# Patient Record
Sex: Male | Born: 1970 | Race: White | Hispanic: No | Marital: Married | Smoking: Former smoker
Health system: Southern US, Community
[De-identification: ages and names within clinical notes are randomized; demographics above are authoritative.]

## PROBLEM LIST (undated history)

## (undated) DIAGNOSIS — G629 Polyneuropathy, unspecified: Secondary | ICD-10-CM

## (undated) DIAGNOSIS — I10 Essential (primary) hypertension: Secondary | ICD-10-CM

## (undated) HISTORY — PX: HEMORRHOID SURGERY: SHX153

---

## 2003-10-04 ENCOUNTER — Other Ambulatory Visit: Payer: Self-pay

## 2004-10-24 ENCOUNTER — Other Ambulatory Visit: Payer: Self-pay

## 2004-10-24 ENCOUNTER — Emergency Department: Payer: Self-pay | Admitting: Emergency Medicine

## 2006-05-28 ENCOUNTER — Emergency Department: Payer: Self-pay

## 2006-05-28 ENCOUNTER — Other Ambulatory Visit: Payer: Self-pay

## 2006-08-14 ENCOUNTER — Emergency Department: Payer: Self-pay | Admitting: Emergency Medicine

## 2006-11-11 ENCOUNTER — Emergency Department: Payer: Self-pay | Admitting: Emergency Medicine

## 2008-03-23 ENCOUNTER — Emergency Department: Payer: Self-pay | Admitting: Emergency Medicine

## 2008-03-23 ENCOUNTER — Other Ambulatory Visit: Payer: Self-pay

## 2008-04-03 ENCOUNTER — Emergency Department: Payer: Self-pay | Admitting: Unknown Physician Specialty

## 2009-12-05 ENCOUNTER — Emergency Department: Payer: Self-pay

## 2012-02-05 ENCOUNTER — Emergency Department: Payer: Self-pay | Admitting: Emergency Medicine

## 2012-02-05 LAB — COMPREHENSIVE METABOLIC PANEL
Albumin: 3.6 g/dL (ref 3.4–5.0)
Alkaline Phosphatase: 83 U/L (ref 50–136)
Anion Gap: 7 (ref 7–16)
BUN: 13 mg/dL (ref 7–18)
Calcium, Total: 8.3 mg/dL — ABNORMAL LOW (ref 8.5–10.1)
Creatinine: 1.08 mg/dL (ref 0.60–1.30)
Glucose: 94 mg/dL (ref 65–99)
Osmolality: 281 (ref 275–301)
Potassium: 4 mmol/L (ref 3.5–5.1)
Sodium: 141 mmol/L (ref 136–145)
Total Protein: 6.8 g/dL (ref 6.4–8.2)

## 2012-02-05 LAB — CBC
HCT: 44.1 % (ref 40.0–52.0)
MCH: 32.8 pg (ref 26.0–34.0)
MCHC: 33.8 g/dL (ref 32.0–36.0)
MCV: 97 fL (ref 80–100)
Platelet: 191 10*3/uL (ref 150–440)
RDW: 13 % (ref 11.5–14.5)
WBC: 7.5 10*3/uL (ref 3.8–10.6)

## 2012-02-05 LAB — URINALYSIS, COMPLETE
Bilirubin,UR: NEGATIVE
Ketone: NEGATIVE
Leukocyte Esterase: NEGATIVE
Nitrite: NEGATIVE
Ph: 6 (ref 4.5–8.0)
RBC,UR: NONE SEEN /HPF (ref 0–5)
Specific Gravity: 1.005 (ref 1.003–1.030)
Squamous Epithelial: <1
WBC UR: <1 /HPF (ref 0–5)

## 2013-10-13 ENCOUNTER — Emergency Department: Payer: Self-pay | Admitting: Internal Medicine

## 2014-06-30 ENCOUNTER — Emergency Department: Payer: Self-pay | Admitting: Emergency Medicine

## 2014-06-30 LAB — URINALYSIS, COMPLETE
BACTERIA: NONE SEEN
Bilirubin,UR: NEGATIVE
Glucose,UR: NEGATIVE mg/dL (ref 0–75)
KETONE: NEGATIVE
Leukocyte Esterase: NEGATIVE
NITRITE: NEGATIVE
Ph: 5 (ref 4.5–8.0)
Protein: NEGATIVE
RBC,UR: 1 /HPF (ref 0–5)
SPECIFIC GRAVITY: 1.02 (ref 1.003–1.030)
Squamous Epithelial: NONE SEEN

## 2014-06-30 LAB — CBC WITH DIFFERENTIAL/PLATELET
BASOS ABS: 0.1 10*3/uL (ref 0.0–0.1)
BASOS PCT: 0.8 %
EOS ABS: 0.3 10*3/uL (ref 0.0–0.7)
Eosinophil %: 2.1 %
HCT: 49.2 % (ref 40.0–52.0)
HGB: 16.9 g/dL (ref 13.0–18.0)
LYMPHS ABS: 2.3 10*3/uL (ref 1.0–3.6)
Lymphocyte %: 19.5 %
MCH: 32.2 pg (ref 26.0–34.0)
MCHC: 34.4 g/dL (ref 32.0–36.0)
MCV: 94 fL (ref 80–100)
MONO ABS: 0.7 x10 3/mm (ref 0.2–1.0)
Monocyte %: 5.6 %
NEUTROS ABS: 8.6 10*3/uL — AB (ref 1.4–6.5)
Neutrophil %: 72 %
Platelet: 266 10*3/uL (ref 150–440)
RBC: 5.25 10*6/uL (ref 4.40–5.90)
RDW: 13.7 % (ref 11.5–14.5)
WBC: 11.9 10*3/uL — AB (ref 3.8–10.6)

## 2014-06-30 LAB — COMPREHENSIVE METABOLIC PANEL
Albumin: 3.8 g/dL (ref 3.4–5.0)
Alkaline Phosphatase: 84 U/L
Anion Gap: 8 (ref 7–16)
BILIRUBIN TOTAL: 0.4 mg/dL (ref 0.2–1.0)
BUN: 14 mg/dL (ref 7–18)
CHLORIDE: 107 mmol/L (ref 98–107)
Calcium, Total: 8.7 mg/dL (ref 8.5–10.1)
Co2: 25 mmol/L (ref 21–32)
Creatinine: 0.92 mg/dL (ref 0.60–1.30)
EGFR (African American): >60
EGFR (Non-African Amer.): >60
GLUCOSE: 123 mg/dL — AB (ref 65–99)
Osmolality: 281 (ref 275–301)
Potassium: 3.8 mmol/L (ref 3.5–5.1)
SGOT(AST): 28 U/L (ref 15–37)
SGPT (ALT): 59 U/L
Sodium: 140 mmol/L (ref 136–145)
TOTAL PROTEIN: 7.4 g/dL (ref 6.4–8.2)

## 2014-06-30 LAB — LIPASE, BLOOD: LIPASE: 178 U/L (ref 73–393)

## 2014-06-30 LAB — TROPONIN I

## 2015-05-24 ENCOUNTER — Encounter: Payer: Self-pay | Admitting: Emergency Medicine

## 2015-05-24 ENCOUNTER — Emergency Department
Admission: EM | Admit: 2015-05-24 | Discharge: 2015-05-24 | Disposition: A | Discharge status: Home / Self Care | Payer: Self-pay | Attending: Emergency Medicine | Admitting: Emergency Medicine

## 2015-05-24 ENCOUNTER — Emergency Department: Payer: Self-pay

## 2015-05-24 DIAGNOSIS — I499 Cardiac arrhythmia, unspecified: Secondary | ICD-10-CM | POA: Insufficient documentation

## 2015-05-24 DIAGNOSIS — M25521 Pain in right elbow: Secondary | ICD-10-CM | POA: Insufficient documentation

## 2015-05-24 DIAGNOSIS — Z720 Tobacco use: Secondary | ICD-10-CM | POA: Insufficient documentation

## 2015-05-24 DIAGNOSIS — M79631 Pain in right forearm: Secondary | ICD-10-CM | POA: Insufficient documentation

## 2015-05-24 DIAGNOSIS — R5383 Other fatigue: Secondary | ICD-10-CM | POA: Insufficient documentation

## 2015-05-24 DIAGNOSIS — M25572 Pain in left ankle and joints of left foot: Secondary | ICD-10-CM | POA: Insufficient documentation

## 2015-05-24 LAB — GLUCOSE, CAPILLARY: GLUCOSE-CAPILLARY: 86 mg/dL (ref 65–99)

## 2015-05-24 MED ORDER — HYDROCODONE-ACETAMINOPHEN 5-325 MG PO TABS
2.0000 | ORAL_TABLET | Freq: Once | ORAL | Status: AC
  Administered 2015-05-24: 2 via ORAL
  Filled 2015-05-24: qty 2

## 2015-05-24 MED ORDER — KETOROLAC TROMETHAMINE 60 MG/2ML IM SOLN
60.0000 mg | Freq: Once | INTRAMUSCULAR | Status: AC
  Administered 2015-05-24: 60 mg via INTRAMUSCULAR
  Filled 2015-05-24: qty 2

## 2015-05-24 MED ORDER — IBUPROFEN 800 MG PO TABS: 800.0000 mg | ORAL_TABLET | Freq: Three times a day (TID) | ORAL | Status: AC | PRN

## 2015-05-24 MED ORDER — HYDROCODONE-ACETAMINOPHEN 5-325 MG PO TABS: 1.0000 | ORAL_TABLET | ORAL | Status: AC | PRN

## 2015-05-24 NOTE — ED Notes (Signed)
Pt presents with left ankle pain and swelling for three mths, no known injury,.

## 2015-05-24 NOTE — Discharge Instructions (Signed)

## 2015-05-24 NOTE — ED Provider Notes (Signed)
[facility] Emergency Department Provider Note  ____________________________________________  Time seen: Approximately 3:51 PM  I have reviewed the triage vital signs and the nursing notes.   HISTORY  Chief Complaint Joint Swelling   HPI Tommy Jacobs is a 44 y.o. male who presents with left foot and ankle pain and swelling for 3 months. He denies trauma. 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 He also drove for relief work in [place] after [employer]. The pain is 10/10; the bottom of his foot as burning pain and pins and needles sensation; the dorsal surface feels numb and heavy, like it is asleep. He denies weakness, but the pain is worse when he bears weight. He has tried ice and ibuprofen with no relief.   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   He has broken his right ankle once and his left ankle three times when playing high school basketball.  He mentioned he has measured FBS in the 200's at home several times, though he's never had a diagnosis of diabetes.  He also mentions (during my ROS questioning) trouble staying asleep, limited ROM and pain in his right hand that shoots up to his elbow, and an irregular heart rhythm diagnosed when he was a child.   History reviewed. No pertinent past medical history.  There are no active problems to display for this patient.   History reviewed. No pertinent past surgical history.  Current Outpatient Rx  Name  Route  Sig  Dispense  Refill  . HYDROcodone-acetaminophen (NORCO) 5-325 MG tablet   Oral   Take 1-2 tablets by mouth every 4 (four) hours as needed for moderate pain.   15 tablet   0   . ibuprofen (ADVIL,MOTRIN) 800 MG tablet   Oral   Take 1 tablet (800 mg total) by mouth every 8 (eight) hours as needed.  30 tablet   0     Allergies Codeine and Diphenhydramine  No family history on file.  Social History Social History  Substance Use Topics  . Smoking status: Current Some Day Smoker  . Smokeless tobacco: None  . Alcohol Use: No     Comment: former drinker    Review of Systems Constitutional: No fever/chills; reports trouble staying asleep, fatigue. Eyes: No visual changes. ENT: No sore throat. Cardiovascular: Denies chest pain. Respiratory: Denies shortness of breath. Gastrointestinal: No abdominal pain.  No nausea, no vomiting.  No diarrhea.  No constipation. Genitourinary: Negative for dysuria. Musculoskeletal: Negative for back pain. Positive for pain in right forearm radiating to elbow. Positive for swelling and pain in left foot and ankle. Skin: Negative for rash. Neurological: Negative for headaches, focal weakness or numbness.  10-point ROS otherwise negative.  ____________________________________________   PHYSICAL EXAM:  VITAL SIGNS: ED Triage Vitals  Enc Vitals Group     BP 05/24/15 1537 153/102 mmHg     Pulse Rate 05/24/15 1537 71     Resp 05/24/15 1537 18     Temp 05/24/15 1537 98.1 F (36.7 C)     Temp Source 05/24/15 1537 Oral     SpO2 05/24/15 1537 97 %     Weight 05/24/15 1537 [facility] 1537  (1.803 m)     Head Cir --  Peak Flow --      Pain Score 05/24/15 1538 10     Pain Loc --      Pain Edu? --      Excl. in GC? --     Constitutional: Alert and oriented. Well appearing and in no acute distress. Eyes: Conjunctivae are normal. PERRL. EOMI. Head: Atraumatic. Nose: No congestion/rhinnorhea. Mouth/Throat: Mucous membranes are moist.  Oropharynx non-erythematous. Neck: No stridor.   Cardiovascular: Normal rate, irregular rhythm. Grossly normal heart sounds.  Good peripheral circulation. Pedal pulses 3+ bilaterally. Respiratory: Normal respiratory effort.  No retractions. Lungs CTAB. Gastrointestinal:  Soft and nontender. No distention.  Musculoskeletal: 5/5 strength in knees, ankles, toes bilaterally. Right foot and ankle non-tender to palpation; full ROM. Left foot and ankle limited ROM, tender to palpation especially plantar surface and medial malleolus. No tenderness to palpation over Achilles tendon. Neurologic:  Normal speech and language. No gross focal neurologic deficits are appreciated. No gait instability. Skin:  Skin is warm, dry and intact. No rash noted. Psychiatric: Mood and affect are normal. Speech and behavior are normal.  ____________________________________________   LABS (all labs ordered are listed, but only abnormal results are displayed)  Labs Reviewed  GLUCOSE, CAPILLARY  CBG MONITORING, ED   ____________________________________________  EKG  None ____________________________________________  RADIOLOGY  Xray left foot, xray left ankle. ____________________________________________   PROCEDURES  Procedure(s) performed: None  Critical Care performed: No  ____________________________________________   INITIAL IMPRESSION / ASSESSMENT AND PLAN / ED COURSE  Pertinent labs & imaging results that were available during my care of the patient were reviewed by me and considered in my medical decision making (see chart for details).  Bone spur vs. Fracture vs diabetic neuropathy vs. Plantar fasciitis.  POC CBG 86.  Xray left foot and ankle shows no fracture or bone spur.   He needs to establish care with a PCP for management of blood sugar and other regular health maintenance. Will refer to Phineas Realharles Drew. ____________________________________________   FINAL CLINICAL IMPRESSION(S) / ED DIAGNOSES  Final diagnoses:  Joint pain of ankle and foot, left      Evangeline Dakinharles M Dwaine Pringle, PA-C 05/24/15 1903  Phineas SemenGraydon Goodman, MD 05/24/15 1926

## 2015-07-05 ENCOUNTER — Emergency Department: Payer: Self-pay

## 2015-07-05 ENCOUNTER — Emergency Department
Admission: EM | Admit: 2015-07-05 | Discharge: 2015-07-05 | Disposition: A | Discharge status: Home / Self Care | Payer: Self-pay | Attending: Student | Admitting: Student

## 2015-07-05 DIAGNOSIS — X501XXA Overexertion from prolonged static or awkward postures, initial encounter: Secondary | ICD-10-CM | POA: Insufficient documentation

## 2015-07-05 DIAGNOSIS — F17210 Nicotine dependence, cigarettes, uncomplicated: Secondary | ICD-10-CM | POA: Insufficient documentation

## 2015-07-05 DIAGNOSIS — Y9389 Activity, other specified: Secondary | ICD-10-CM | POA: Insufficient documentation

## 2015-07-05 DIAGNOSIS — S93402A Sprain of unspecified ligament of left ankle, initial encounter: Secondary | ICD-10-CM | POA: Insufficient documentation

## 2015-07-05 DIAGNOSIS — Z79899 Other long term (current) drug therapy: Secondary | ICD-10-CM | POA: Insufficient documentation

## 2015-07-05 DIAGNOSIS — Y9289 Other specified places as the place of occurrence of the external cause: Secondary | ICD-10-CM | POA: Insufficient documentation

## 2015-07-05 DIAGNOSIS — S93402D Sprain of unspecified ligament of left ankle, subsequent encounter: Secondary | ICD-10-CM

## 2015-07-05 DIAGNOSIS — Y998 Other external cause status: Secondary | ICD-10-CM | POA: Insufficient documentation

## 2015-07-05 HISTORY — DX: Polyneuropathy, unspecified: G62.9

## 2015-07-05 MED ORDER — NAPROXEN 500 MG PO TABS: 500.0000 mg | ORAL_TABLET | Freq: Two times a day (BID) | ORAL | Status: DC

## 2015-07-05 MED ORDER — TRAMADOL HCL 50 MG PO TABS: 50.0000 mg | ORAL_TABLET | Freq: Four times a day (QID) | ORAL | Status: DC | PRN

## 2015-07-05 MED ORDER — OXYCODONE-ACETAMINOPHEN 5-325 MG PO TABS
2.0000 | ORAL_TABLET | Freq: Once | ORAL | Status: AC
  Administered 2015-07-05: 2 via ORAL
  Filled 2015-07-05: qty 2

## 2015-07-05 MED ORDER — OXYCODONE-ACETAMINOPHEN 5-325 MG PO TABS: 2.0000 | ORAL_TABLET | Freq: Once | ORAL | Status: DC

## 2015-07-05 NOTE — ED Notes (Signed)
Pt wheeled out to wifes car

## 2015-07-05 NOTE — ED Notes (Signed)
States he twisted his ankle on Monday conts to have pain and swelling. Unable to bear wt

## 2015-07-05 NOTE — ED Provider Notes (Signed)
[facility] Emergency Department Provider Note  ____________________________________________  Time seen: Approximately 1:10 PM  I have reviewed the triage vital signs and the nursing notes.   HISTORY  Chief Complaint Ankle Pain    HPI Tommy Jacobs is a 44 y.o. male complaining of left ankle pain secondary to a twisting incident in the shower last night. Patient states since is that he's been unable to bear weight secondary to this pain complaint. Patient is rating his pain as a 10 over 10 and describes pain as sharp. No palliative measures taken  prior to arrival to the ER.  Icepack was given at triage.Patient drove himself to the ER but refused weight-bear after coming to the hospital.   Past Medical History  Diagnosis Date  . Neuropathy (HCC)     There are no active problems to display for this patient.   Past Surgical History  Procedure Laterality Date  . Hemorrhoid surgery      Current Outpatient Rx  Name  Route  Sig  Dispense  Refill  . gabapentin (NEURONTIN) 300 MG capsule   Oral   Take 300 mg by mouth 3 (three) times daily.         Marland Kitchen. HYDROcodone-acetaminophen (NORCO) 5-325 MG tablet   Oral   Take 1-2 tablets by mouth every 4 (four) hours as needed for moderate pain.   15 tablet   0   . ibuprofen (ADVIL,MOTRIN) 800 MG tablet   Oral   Take 1 tablet (800 mg total) by mouth every 8 (eight) hours as needed.   30 tablet   0     Allergies Codeine and Diphenhydramine  No family history on file.  Social History Social History  Substance Use Topics  . Smoking status: Current Some Day Smoker    Types: Cigarettes  . Smokeless tobacco: None  . Alcohol Use: No     Comment: former drinker    Review of Systems Constitutional: No fever/chills Eyes: No visual changes. ENT: No sore throat. Cardiovascular: Denies chest pain. Respiratory: Denies shortness of breath. Gastrointestinal: No abdominal pain.  No nausea, no vomiting.   No diarrhea.  No constipation. Genitourinary: Negative for dysuria. Musculoskeletal: Left ankle pain  Skin: Negative for rash. Neurological: Negative for headaches, focal weakness or numbness. Allergic/Immunilogical: Codeine and Benadryl 10-point ROS otherwise negative.  ____________________________________________   PHYSICAL EXAM:  VITAL SIGNS: ED Triage Vitals  Enc Vitals Group     BP 07/05/15 1237 153/88 mmHg     Pulse Rate 07/05/15 1237 81     Resp 07/05/15 1237 18     Temp 07/05/15 1237 97.7 F (36.5 C)     Temp Source 07/05/15 1237 Oral     SpO2 07/05/15 1237 97 %     Weight 07/05/15 1237 220 lb (99.791 kg)     Height 07/05/15 1237 5\' 11"  (1.803 m)     Head Cir --      Peak Flow --      Pain Score 07/05/15 1258 10     Pain Loc --      Pain Edu? --      Excl. in GC? --     Constitutional: Alert and oriented. Well appearing and in no acute distress. Eyes: Conjunctivae are normal. PERRL. EOMI. Head: Atraumatic. Nose: No congestion/rhinnorhea. Mouth/Throat: Mucous membranes are moist.  Oropharynx non-erythematous. Neck: No stridor.  No cervical spine tenderness to palpation. Hematological/Lymphatic/Immunilogical: No cervical lymphadenopathy. Cardiovascular: Normal rate, regular rhythm. Grossly normal heart sounds.  Good peripheral circulation. Respiratory: Normal respiratory effort.  No retractions. Lungs CTAB. Gastrointestinal: Soft and nontender. No distention. No abdominal bruits. No CVA tenderness. Musculoskeletal: No obvious deformity or edema to the ankle. Moderate Guarding with palpation medial and lateral aspect of the ankle.  No joint effusions. Neurologic:  Normal speech and language. No gross focal neurologic deficits are appreciated. No gait instability. Skin:  Skin is warm, dry and intact. No rash noted. Psychiatric: Mood and affect are normal. Speech and behavior are normal.  ____________________________________________   LABS (all labs ordered are  listed, but only abnormal results are displayed)  Labs Reviewed - No data to display ____________________________________________  EKG   ____________________________________________  RADIOLOGY  No acute findings on x-ray. I, Joni Reining, personally viewed and evaluated these images (plain radiographs) as part of my medical decision making.   ____________________________________________   PROCEDURES  Procedure(s) performed: None  Critical Care performed: No  ____________________________________________   INITIAL IMPRESSION / ASSESSMENT AND PLAN / ED COURSE  Pertinent labs & imaging results that were available during my care of the patient were reviewed by me and considered in my medical decision making (see chart for details).  Sprain left ankle. Patient given  discharged care instructions placing the ankle stirrup splint. Given a prescription for ibuprofen. Patient advised to follow orthopedics due to his  history of spraining his ankle. ____________________________________________   FINAL CLINICAL IMPRESSION(S) / ED DIAGNOSES  Final diagnoses:  Left ankle sprain, subsequent encounter      Joni Reining, PA-C 07/05/15 1359  Gayla Doss, MD 07/05/15 1550

## 2015-07-05 NOTE — ED Notes (Signed)
Xray at BS for portable  

## 2015-07-05 NOTE — ED Notes (Signed)
Pt aware from when pain meds given cannot drive after. Waiting in room until wife arrives.

## 2015-07-05 NOTE — Discharge Instructions (Signed)
Wear ankle support for 3-5 days and follow orthopedics clinic for definitive evaluation and treatment

## 2015-07-05 NOTE — ED Notes (Signed)
Pt c/o left ankle pain since twisting it in the shower Monday night.

## 2016-01-17 DIAGNOSIS — M25461 Effusion, right knee: Secondary | ICD-10-CM | POA: Insufficient documentation

## 2016-01-22 DIAGNOSIS — I10 Essential (primary) hypertension: Secondary | ICD-10-CM | POA: Insufficient documentation

## 2016-02-27 IMAGING — CR DG ANKLE COMPLETE 3+V*L*
1 series · 3 of 3 positions shown · non-contrast
Comparison: 05/24/2015

CLINICAL DATA: Rolled left ankle [REDACTED] night in the shower,
diffuse ankle pain

EXAM:
LEFT ANKLE COMPLETE - 3+ VIEW

[Series 1: ap · 0.17mm/px · 3 of 3 slices shown]
[im 1/3]
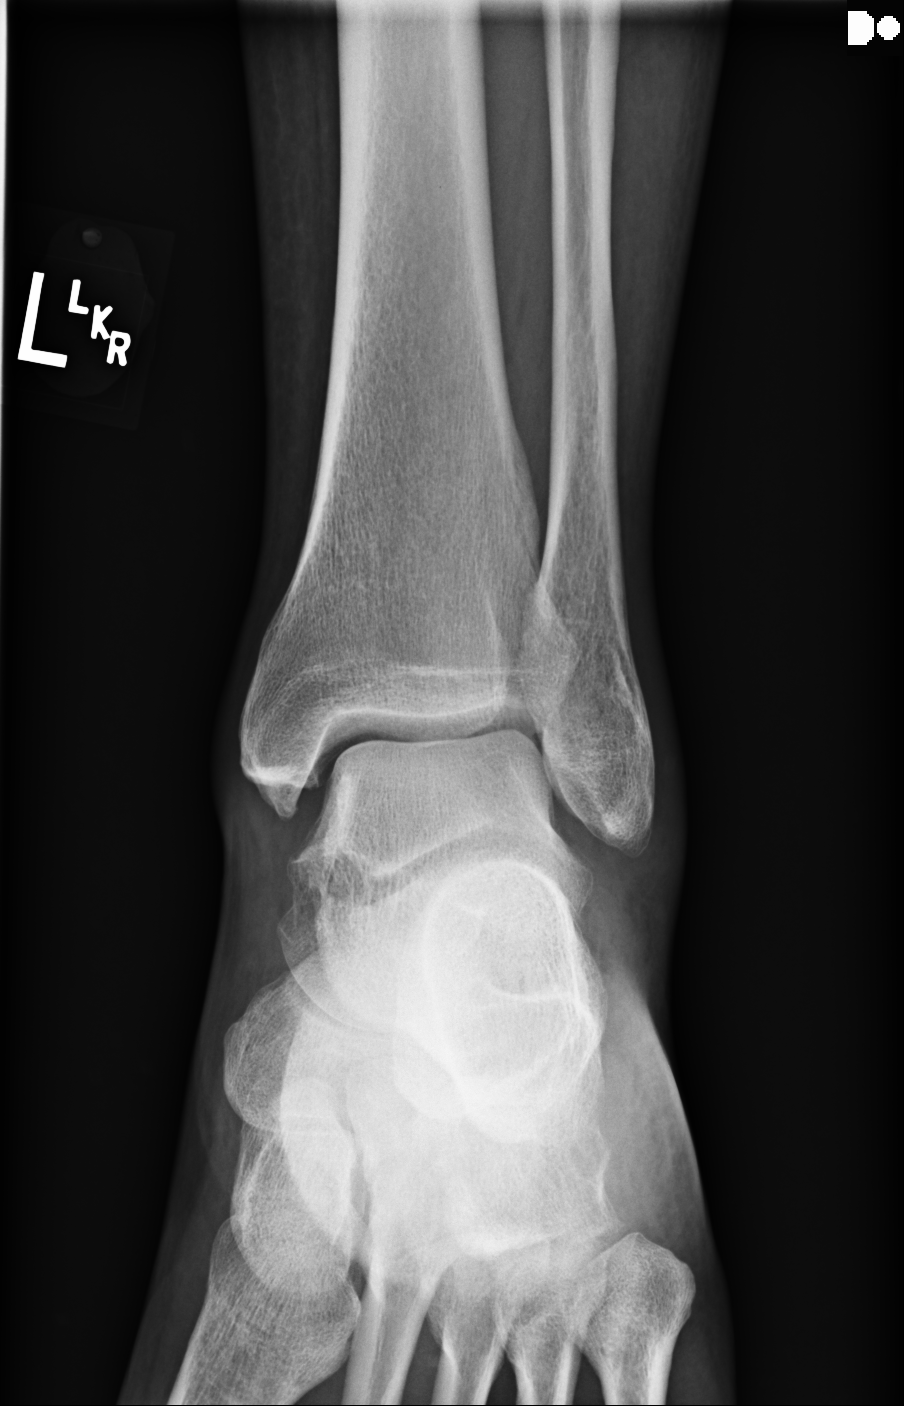
[im 2/3]
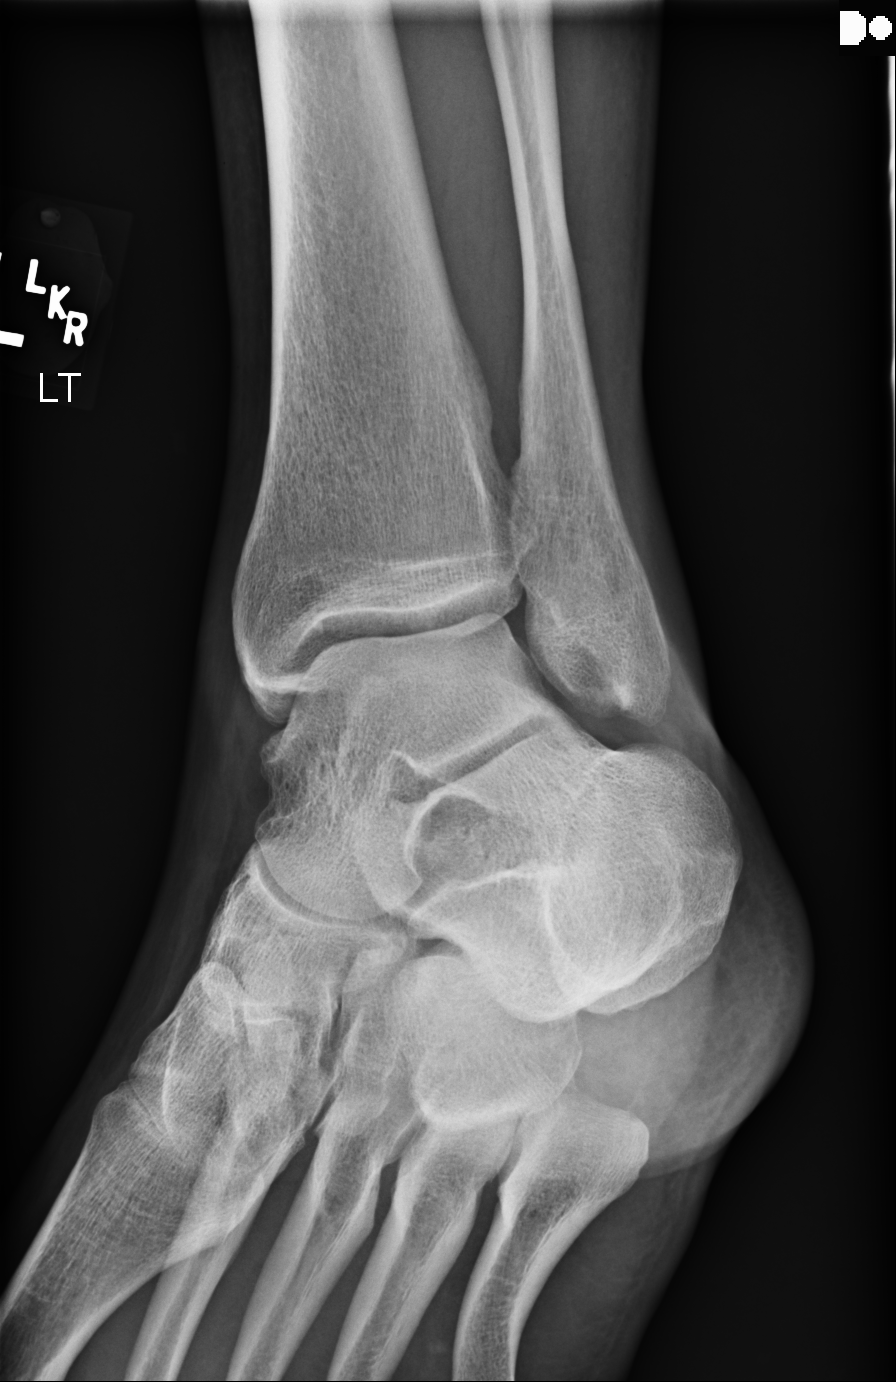
[im 3/3]
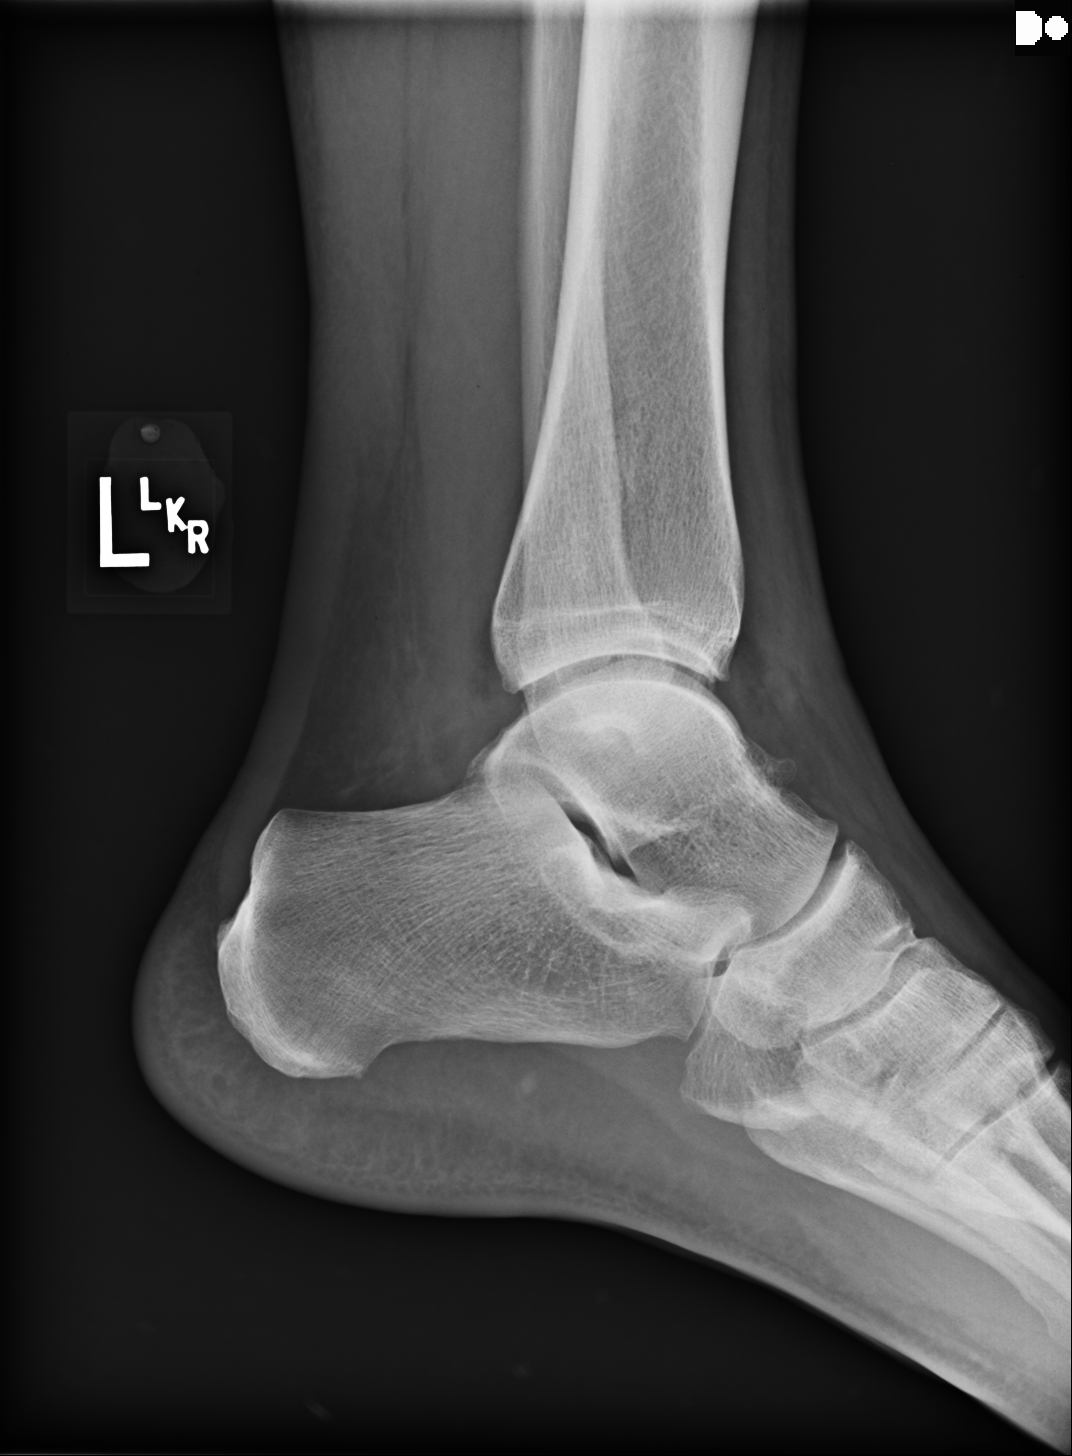

[3 of 3 positions shown; findings below may reference images not displayed]

FINDINGS: Three views of left ankle submitted. No acute fracture or
subluxation. Ankle mortise is preserved. Stable minimal spurring
distal tibia medial malleolus.
IMPRESSION: Negative.

## 2016-06-24 ENCOUNTER — Other Ambulatory Visit: Payer: Self-pay | Admitting: Internal Medicine

## 2016-06-24 DIAGNOSIS — M792 Neuralgia and neuritis, unspecified: Secondary | ICD-10-CM

## 2016-07-17 ENCOUNTER — Ambulatory Visit
Admission: RE | Admit: 2016-07-17 | Discharge: 2016-07-17 | Disposition: A | Discharge status: Home / Self Care | Payer: BLUE CROSS/BLUE SHIELD | Source: Ambulatory Visit | Attending: Internal Medicine | Admitting: Internal Medicine

## 2016-07-17 DIAGNOSIS — M722 Plantar fascial fibromatosis: Secondary | ICD-10-CM | POA: Insufficient documentation

## 2016-07-17 DIAGNOSIS — M67972 Unspecified disorder of synovium and tendon, left ankle and foot: Secondary | ICD-10-CM | POA: Insufficient documentation

## 2016-07-17 DIAGNOSIS — G5792 Unspecified mononeuropathy of left lower limb: Secondary | ICD-10-CM | POA: Diagnosis present

## 2016-07-17 DIAGNOSIS — M792 Neuralgia and neuritis, unspecified: Secondary | ICD-10-CM

## 2016-07-17 MED ORDER — GADOBENATE DIMEGLUMINE 529 MG/ML IV SOLN
20.0000 mL | Freq: Once | INTRAVENOUS | Status: AC | PRN
  Administered 2016-07-17: 20 mL via INTRAVENOUS

## 2016-08-12 ENCOUNTER — Encounter: Payer: Self-pay | Admitting: Emergency Medicine

## 2016-08-12 ENCOUNTER — Ambulatory Visit
Admission: EM | Admit: 2016-08-12 | Discharge: 2016-08-12 | Disposition: A | Discharge status: Home / Self Care | Payer: BLUE CROSS/BLUE SHIELD | Attending: Family Medicine | Admitting: Family Medicine

## 2016-08-12 DIAGNOSIS — J40 Bronchitis, not specified as acute or chronic: Secondary | ICD-10-CM | POA: Diagnosis not present

## 2016-08-12 DIAGNOSIS — R6889 Other general symptoms and signs: Secondary | ICD-10-CM

## 2016-08-12 DIAGNOSIS — J111 Influenza due to unidentified influenza virus with other respiratory manifestations: Secondary | ICD-10-CM

## 2016-08-12 DIAGNOSIS — R69 Illness, unspecified: Secondary | ICD-10-CM | POA: Diagnosis not present

## 2016-08-12 MED ORDER — AZITHROMYCIN 250 MG PO TABS: ORAL_TABLET | ORAL | 0 refills | Status: DC

## 2016-08-12 MED ORDER — BENZONATATE 200 MG PO CAPS: 200.0000 mg | ORAL_CAPSULE | Freq: Three times a day (TID) | ORAL | 0 refills | Status: DC | PRN

## 2016-08-12 MED ORDER — FEXOFENADINE-PSEUDOEPHED ER 180-240 MG PO TB24: 1.0000 | ORAL_TABLET | Freq: Every day | ORAL | 0 refills | Status: DC

## 2016-08-12 NOTE — ED Triage Notes (Signed)
Patient c/o cough, chills, bodyaches, and congestion that started on Friday.

## 2016-08-12 NOTE — ED Provider Notes (Signed)
[facility]    CSN: [id number] Arrival date & time: 08/12/16  0808     History   Chief Complaint Chief Complaint  Patient presents with  . Cough  . Headache  . Generalized Body Aches    HPI Tommy Jacobs is a 46 y.o. male.   Patient is a 46 year old white male who presents with a history of over 72 hours aching all over fever cough and congestion. He states everything started on Friday morning and his progress. Has not gotten better fitting things got worse. He reports coughing up yellowish-green phlegm. He is a former smoker he's allergic to codeine and Benadryl. He has history of gout hypertension neuropathy and takes Percocet for the neuropathy. He states the Percocet didn't control the pain and aching that he was experiencing. His wife is also sick now sore throat is minimal. And states he did have a fever last night. No other pertinent family medical history relevant to today's visit.   The history is provided by the patient. No language interpreter was used.  Cough  Cough characteristics:  Productive Sputum characteristics:  Yellow and green Severity:  Moderate Onset quality:  Sudden Timing:  Constant Progression:  Worsening Chronicity:  New Smoker: no   Context: upper respiratory infection   Relieved by:  Nothing Worsened by:  Nothing Ineffective treatments:  Decongestant and cough suppressants Associated symptoms: headaches, myalgias and sinus congestion   Associated symptoms: no sore throat   Headache  Associated symptoms: cough and myalgias   Associated symptoms: no sore throat     Past Medical History:  Diagnosis Date  . Neuropathy (HCC)     There are no active problems to display for this patient.   Past Surgical History:  Procedure Laterality Date  . HEMORRHOID SURGERY         Home Medications    Prior to Admission medications   Medication Sig Start Date End Date Taking? Authorizing Provider  ALLOPURINOL PO Take by mouth.    Yes Historical Provider, MD  ALPRAZolam Prudy Feeler(XANAX) 0.5 MG tablet Take 0.5 mg by mouth at bedtime as needed for anxiety.   Yes Historical Provider, MD  lisinopril (PRINIVIL,ZESTRIL) 10 MG tablet Take 10 mg by mouth daily.   Yes Historical Provider, MD  pregabalin (LYRICA) 300 MG capsule Take 300 mg by mouth daily.   Yes Historical Provider, MD  azithromycin (ZITHROMAX Z-PAK) 250 MG tablet Take 2 tablets first day and then 1 po a day for 4 days 08/12/16   Hassan RowanEugene Kyandra Mcclaine, MD  benzonatate (TESSALON) 200 MG capsule Take 1 capsule (200 mg total) by mouth 3 (three) times daily as needed. 08/12/16   Hassan RowanEugene Jearl Soto, MD  benzonatate (TESSALON) 200 MG capsule Take 1 capsule (200 mg total) by mouth 3 (three) times daily as needed. 08/12/16   Hassan RowanEugene Najib Colmenares, MD  fexofenadine-pseudoephedrine (ALLEGRA-D ALLERGY & CONGESTION) 180-240 MG 24 hr tablet Take 1 tablet by mouth daily. 08/12/16   Hassan RowanEugene Keayra Graham, MD  HYDROcodone-acetaminophen (NORCO) 5-325 MG tablet Take 1-2 tablets by mouth every 4 (four) hours as needed for moderate pain. 05/24/15   Charmayne Sheerharles M Beers, PA-C  ibuprofen (ADVIL,MOTRIN) 800 MG tablet Take 1 tablet (800 mg total) by mouth every 8 (eight) hours as needed. 05/24/15   Evangeline Dakinharles M Beers, PA-C  naproxen (NAPROSYN) 500 MG tablet Take 1 tablet (500 mg total) by mouth 2 (two) times daily with a meal. 07/05/15   Joni Reiningonald K Smith, PA-C    Family History History reviewed. No pertinent  family history.  Social History Social History  Substance Use Topics  . Smoking status: Former Smoker    Types: Cigarettes  . Smokeless tobacco: Never Used  . Alcohol use No     Comment: former drinker     Allergies   Codeine and Diphenhydramine   Review of Systems Review of Systems  HENT: Negative for sore throat.   Respiratory: Positive for cough.   Musculoskeletal: Positive for myalgias.  Neurological: Positive for headaches.  All other systems reviewed and are negative.    Physical Exam Triage Vital Signs ED Triage  Vitals  Enc Vitals Group     BP 08/12/16 0822 123/81     Pulse Rate 08/12/16 0822 75     Resp 08/12/16 0822 16     Temp 08/12/16 0822 98.3 F (36.8 C)     Temp Source 08/12/16 0822 Oral     SpO2 08/12/16 0822 99 %     Weight 08/12/16 0818 227 lb (103 kg)     Height 08/12/16 0818 5' 11.5" (1.816 m)     Head Circumference --      Peak Flow --      Pain Score 08/12/16 0821 5     Pain Loc --      Pain Edu? --      Excl. in GC? --    No data found.   Updated Vital Signs BP 123/81 (BP Location: Left Arm)   Pulse 75   Temp 98.3 F (36.8 C) (Oral)   Resp 16   Ht 5' 11.5" (1.816 m)   Wt 227 lb (103 kg)   SpO2 99%   BMI 31.22 kg/m   Visual Acuity Right Eye Distance:   Left Eye Distance:   Bilateral Distance:    Right Eye Near:   Left Eye Near:    Bilateral Near:     Physical Exam  Constitutional: He is oriented to person, place, and time. He appears well-developed and well-nourished.  HENT:  Head: Normocephalic and atraumatic.  Right Ear: External ear normal.  Left Ear: External ear normal.  Nose: Nose normal.  Mouth/Throat: Oropharynx is clear and moist.  Eyes: Pupils are equal, round, and reactive to light.  Neck: Normal range of motion. Neck supple. No tracheal deviation present. No thyromegaly present.  Cardiovascular: Normal rate and regular rhythm.   Pulmonary/Chest: Effort normal.  Musculoskeletal: Normal range of motion.  Lymphadenopathy:    He has cervical adenopathy.  Neurological: He is alert and oriented to person, place, and time.  Skin: Skin is warm.  Psychiatric: He has a normal mood and affect.  Vitals reviewed.    UC Treatments / Results  Labs (all labs ordered are listed, but only abnormal results are displayed) Labs Reviewed - No data to display  EKG  EKG Interpretation None       Radiology No results found.  Procedures Procedures (including critical care time)  Medications Ordered in UC Medications - No data to  display   Initial Impression / Assessment and Plan / UC Course  I have reviewed the triage vital signs and the nursing notes.  Pertinent labs & imaging results that were available during my care of the patient were reviewed by me and considered in my medical decision making (see chart for details).   sprained patient or I tried to explain to patient that the flu symptoms he is having started on Friday so is now over 72 hours since everything is started. At this time is  question whether Tamiflu would be effective I will still prescribed if he wants since he apparently doesn't want to take the Tamiflu with mild questions you've asking will hold off on putting him on Tamiflu since he feels everything is getting so much worse on propofol place him on a round of Zithromax Z-Pak but explained to him this is still probably viral we'll place mild Allegra-D and Tessalon Perles follow-up PCP next week if not better with for today and tomorrow as well.    Final Clinical Impressions(s) / UC Diagnoses   Final diagnoses:  Influenza-like illness  Flu-like symptoms  Bronchitis    New Prescriptions Discharge Medication List as of 08/12/2016  8:44 AM    START taking these medications   Details  !! benzonatate (TESSALON) 200 MG capsule Take 1 capsule (200 mg total) by mouth 3 (three) times daily as needed., Starting Mon 08/12/2016, Normal    !! benzonatate (TESSALON) 200 MG capsule Take 1 capsule (200 mg total) by mouth 3 (three) times daily as needed., Starting Mon 08/12/2016, Normal     !! - Potential duplicate medications found. Please discuss with provider.       Note: This dictation was prepared with Dragon dictation along with smaller phrase technology. Any transcriptional errors that result from this process are unintentional.   Hassan Rowan, MD 08/12/16 [phone]

## 2016-08-17 ENCOUNTER — Telehealth: Payer: Self-pay

## 2016-08-17 ENCOUNTER — Ambulatory Visit
Admission: EM | Admit: 2016-08-17 | Discharge: 2016-08-17 | Disposition: A | Discharge status: Home / Self Care | Payer: BLUE CROSS/BLUE SHIELD | Attending: Family Medicine | Admitting: Family Medicine

## 2016-08-17 ENCOUNTER — Ambulatory Visit (INDEPENDENT_AMBULATORY_CARE_PROVIDER_SITE_OTHER): Payer: BLUE CROSS/BLUE SHIELD

## 2016-08-17 DIAGNOSIS — J020 Streptococcal pharyngitis: Secondary | ICD-10-CM | POA: Diagnosis not present

## 2016-08-17 DIAGNOSIS — R059 Cough, unspecified: Secondary | ICD-10-CM

## 2016-08-17 DIAGNOSIS — R05 Cough: Secondary | ICD-10-CM

## 2016-08-17 LAB — RAPID STREP SCREEN (MED CTR MEBANE ONLY): Streptococcus, Group A Screen (Direct): POSITIVE — AB

## 2016-08-17 MED ORDER — AMOXICILLIN-POT CLAVULANATE 875-125 MG PO TABS: 1.0000 | ORAL_TABLET | Freq: Two times a day (BID) | ORAL | 0 refills | Status: DC

## 2016-08-17 MED ORDER — TRAMADOL HCL 50 MG PO TABS: 50.0000 mg | ORAL_TABLET | Freq: Four times a day (QID) | ORAL | 0 refills | Status: DC | PRN

## 2016-08-17 NOTE — Discharge Instructions (Signed)
Your chest x-ray was negative.  I am treating him for strep pharyngitis as this test was positive.  Tramadol for pain.  Take care  Dr. Adriana Simasook

## 2016-08-17 NOTE — ED Triage Notes (Signed)
Patient complains of cough, fevers, body aches. Patient states that he was seen and treated on Monday with azithromycin. Patient states that he had improved and worsened again this week. Patient states that he did not have a cough on Monday and he states that he feels like his bones are hurting.

## 2016-08-17 NOTE — Telephone Encounter (Signed)
Patient called in this morning stating that he was seen on Monday by Dr. Thurmond ButtsWade. Patient states that he improved and worsened again. I informed the patient that he would need to be seen again since he could have a secondary infection. Patient informed me that he could not afford the copay, I told him registration may be willing to bill him. Patient told me "if I die then its on your hands." I told him that I have made efforts to get him seen and offered ER or Primary care followups. Patient was very rude and hung up the phone telling me thanks for nothing.

## 2016-08-17 NOTE — ED Provider Notes (Signed)
[facility]    CSN: [id number] Arrival date & time: 08/17/16  1552     History   Chief Complaint Chief Complaint  Patient presents with  . Cough   HPI  46 year old male presents with complaints of cough, body aches, subjective fever and chills.  Patient symptoms started on Friday of last week. He was seen here on 1/29. He was out of the window for Tamiflu and therefore it was not given. Given reports of productive cough and other symptoms, he was treated empirically with azithromycin.  He presents today for reevaluation. He states that he got better with treatment and then subsequently worsened this week. He reports severe body aches. He states that "his bones hurt". He reports subjective fever and chills. He states that he's hot and cold. He reports severe cough and associated chest discomfort. Cough is productive of thick green sputum. He reports some increased work of breathing and attributes this to pain. He has completed his antibiotic course. No other complaints or concerns at this time.  Past Medical History:  Diagnosis Date  . Neuropathy [facility])    Past Surgical History:  Procedure Laterality Date  . HEMORRHOID SURGERY      Home Medications    Prior to Admission medications   Medication Sig Start Date End Date Taking? Authorizing Provider  ALLOPURINOL PO Take by mouth.   Yes Historical Provider, MD  ALPRAZolam Prudy Feeler) 0.5 MG tablet Take 0.5 mg by mouth at bedtime as needed for anxiety.   Yes Historical Provider, MD  benzonatate (TESSALON) 200 MG capsule Take 1 capsule (200 mg total) by mouth 3 (three) times daily as needed. 08/12/16  Yes Hassan Rowan, MD  benzonatate (TESSALON) 200 MG capsule Take 1 capsule (200 mg total) by mouth 3 (three) times daily as needed. 08/12/16  Yes Hassan Rowan, MD  fexofenadine-pseudoephedrine (ALLEGRA-D ALLERGY & CONGESTION) 180-240 MG 24 hr tablet Take 1 tablet by mouth daily. 08/12/16  Yes Hassan Rowan, MD  HYDROcodone-acetaminophen  (NORCO) 5-325 MG tablet Take 1-2 tablets by mouth every 4 (four) hours as needed for moderate pain. 05/24/15  Yes Charmayne Sheer Beers, PA-C  ibuprofen (ADVIL,MOTRIN) 800 MG tablet Take 1 tablet (800 mg total) by mouth every 8 (eight) hours as needed. 05/24/15  Yes Charmayne Sheer Beers, PA-C  lisinopril (PRINIVIL,ZESTRIL) 10 MG tablet Take 10 mg by mouth daily.   Yes Historical Provider, MD  naproxen (NAPROSYN) 500 MG tablet Take 1 tablet (500 mg total) by mouth 2 (two) times daily with a meal. 07/05/15  Yes Joni Reining, PA-C  pregabalin (LYRICA) 300 MG capsule Take 300 mg by mouth daily.   Yes Historical Provider, MD  amoxicillin-clavulanate (AUGMENTIN) 875-125 MG tablet Take 1 tablet by mouth 2 (two) times daily. 08/17/16   Tommie Sams, DO  azithromycin (ZITHROMAX Z-PAK) 250 MG tablet Take 2 tablets first day and then 1 po a day for 4 days 08/12/16   Hassan Rowan, MD  traMADol (ULTRAM) 50 MG tablet Take 1 tablet (50 mg total) by mouth every 6 (six) hours as needed. 08/17/16   Tommie Sams, DO   Family History History reviewed. No pertinent family history.  Social History Social History  Substance Use Topics  . Smoking status: Former Smoker    Types: Cigarettes  . Smokeless tobacco: Never Used  . Alcohol use No     Comment: former drinker   Allergies   Codeine and Diphenhydramine   Review of Systems Review of Systems  Constitutional: Positive  for chills and fever.  HENT: Positive for sore throat.   Respiratory: Positive for cough and shortness of breath.   Musculoskeletal:       Severe body aches.   Physical Exam Triage Vital Signs ED Triage Vitals  Enc Vitals Group     BP 08/17/16 1612 127/74     Pulse Rate 08/17/16 1612 97     Resp 08/17/16 1612 18     Temp 08/17/16 1612 98.7 F (37.1 C)     Temp Source 08/17/16 1612 Oral     SpO2 08/17/16 1612 98 %     Weight 08/17/16 1611 227 lb (103 kg)     Height 08/17/16 1611 5' 11.5" (1.816 m)     Head Circumference --      Peak Flow --       Pain Score 08/17/16 1611 10     Pain Loc --      Pain Edu? --      Excl. in GC? --    Updated Vital Signs BP 127/74 (BP Location: Left Arm)   Pulse 97   Temp 98.7 F (37.1 C) (Oral)   Resp 18   Ht 5' 11.5" (1.816 m)   Wt 227 lb (103 kg)   SpO2 98%   BMI 31.22 kg/m   Physical Exam  Constitutional: He is oriented to person, place, and time.  Appears ill. Mild increased work of breathing.  HENT:  Head: Normocephalic and atraumatic.  Moderate oropharyngeal erythema.  Eyes: Conjunctivae are normal.  Neck: Neck supple.  Cardiovascular: Regular rhythm.   Tachycardic.  Pulmonary/Chest:  Mild increased work of breathing. No adventitious breath sounds appreciated.  Neurological: He is alert and oriented to person, place, and time.  Vitals reviewed.  UC Treatments / Results  Labs (all labs ordered are listed, but only abnormal results are displayed) Labs Reviewed  RAPID STREP SCREEN (NOT AT [facility]) - Abnormal; Notable for the following:       Result Value   Streptococcus, Group A Screen (Direct) POSITIVE (*)    All other components within normal limits    EKG  EKG Interpretation None      Radiology Dg Chest 2 View  Result Date: 08/17/2016 CLINICAL DATA:  Cough and fever for 1 week EXAM: CHEST  2 VIEW COMPARISON:  October 13, 2013 FINDINGS: The heart size and mediastinal contours are within normal limits. Both lungs are clear. The visualized skeletal structures are unremarkable. IMPRESSION: No active cardiopulmonary disease. Electronically Signed   By: Gerome Samavid  Williams III M.D   On: 08/17/2016 17:56   Procedures Procedures (including critical care time)  Medications Ordered in UC Medications - No data to display  Initial Impression / Assessment and Plan / UC Course  I have reviewed the triage vital signs and the nursing notes.  Pertinent labs & imaging results that were available during my care of the patient were reviewed by me and considered in my medical decision  making (see chart for details).   46 year old male presents with severe body aches, subjective fever and chills, and productive cough. Patient appears ill. Concern for recent influenza and subsequent development of acute bacterial infection. Will obtain chest x-ray. Also swabbing for strep throat given ST and physical exam findings.   1800 - Chest x-ray negative per radiology. Rapid strep was positive. Will treat for strep pharyngitis. Tramadol as need for pain.   Final Clinical Impressions(s) / UC Diagnoses   Final diagnoses:  Cough  Strep pharyngitis  New Prescriptions Discharge Medication List as of 08/17/2016  6:04 PM    START taking these medications   Details  amoxicillin-clavulanate (AUGMENTIN) 875-125 MG tablet Take 1 tablet by mouth 2 (two) times daily., Starting Sat 08/17/2016, Normal    traMADol (ULTRAM) 50 MG tablet Take 1 tablet (50 mg total) by mouth every 6 (six) hours as needed., Starting Sat 08/17/2016, Normal         Tommie Sams, DO 08/17/16 1808

## 2017-11-11 ENCOUNTER — Ambulatory Visit (INDEPENDENT_AMBULATORY_CARE_PROVIDER_SITE_OTHER): Payer: BLUE CROSS/BLUE SHIELD

## 2017-11-11 ENCOUNTER — Encounter: Payer: Self-pay | Admitting: Podiatry

## 2017-11-11 ENCOUNTER — Other Ambulatory Visit: Payer: Self-pay | Admitting: Podiatry

## 2017-11-11 ENCOUNTER — Ambulatory Visit: Payer: BLUE CROSS/BLUE SHIELD | Admitting: Podiatry

## 2017-11-11 DIAGNOSIS — M722 Plantar fascial fibromatosis: Secondary | ICD-10-CM

## 2017-11-11 DIAGNOSIS — R52 Pain, unspecified: Secondary | ICD-10-CM

## 2017-11-13 NOTE — Progress Notes (Signed)
   Subjective: Patient presents today for pain and tenderness in the left heel that has been ongoing for the past 1-2 years. He states the heel "feels dead". He has been seen by a specialist in the past, had an MRI and ultrasound. He has been taking Percocet and Lyrica with no significant relief. He reports a previous fracture of the left foot. Patient presents today for further treatment and evaluation.   Past Medical History:  Diagnosis Date  . Neuropathy      Objective: Physical Exam General: The patient is alert and oriented x3 in no acute distress.  Dermatology: Skin is warm, dry and supple bilateral lower extremities. Negative for open lesions or macerations bilateral.   Vascular: Dorsalis Pedis and Posterior Tibial pulses palpable bilateral.  Capillary fill time is immediate to all digits.  Neurological: Epicritic and protective threshold intact bilateral.   Musculoskeletal: Tenderness to palpation to the plantar aspect of the left heel along the plantar fascia. All other joints range of motion within normal limits bilateral. Strength 5/5 in all groups bilateral.   Radiographic exam:   Normal osseous mineralization. Joint spaces preserved. No fracture/dislocation/boney destruction. No other soft tissue abnormalities or radiopaque foreign bodies.   Assessment: 1. Plantar fasciitis left foot  Plan of Care:  1. Patient evaluated. Xrays reviewed.   2. Recommended plantar fascial surgery. Patient is having a baby in 6 weeks.  3. Plantar fascial brace dispensed.  4. Return to clinic when ready for surgery.    Felecia Shelling, DPM [facility]  Dr. Felecia Shelling, DPM    [address] [place], [place] [address]                Office [phone]  Fax [phone]

## 2017-12-11 ENCOUNTER — Telehealth: Payer: Self-pay | Admitting: *Deleted

## 2017-12-11 NOTE — Telephone Encounter (Signed)
"  I'm calling to schedule my husband's surgery."  Has he signed consent forms?  "I don't know if he has or not."  I read Dr. Michel Harrow note.  Your husband has not signed consent forms yet.  He will need to see Dr. Logan Bores again before we can get him scheduled for surgery.  "Okay, can we schedule that appointment?"  I will transfer you to a scheduler.  Please let them know you want to schedule him an appointment for the [facility] office.  "I will, thanks."

## 2018-01-06 ENCOUNTER — Ambulatory Visit: Payer: BLUE CROSS/BLUE SHIELD | Admitting: Podiatry

## 2018-01-06 ENCOUNTER — Encounter: Payer: Self-pay | Admitting: Podiatry

## 2018-01-06 DIAGNOSIS — M722 Plantar fascial fibromatosis: Secondary | ICD-10-CM

## 2018-01-06 NOTE — Patient Instructions (Signed)
Pre-Operative Instructions  Congratulations, you have decided to take an important step towards improving your quality of life.  You can be assured that the doctors and staff at [facility] will be with you every step of the way.  Here are some important things you should know:  1. Plan to be at the surgery center/hospital at least 1 (one) hour prior to your scheduled time, unless otherwise directed by the surgical center/hospital staff.  You must have a responsible adult accompany you, remain during the surgery and drive you home.  Make sure you have directions to the surgical center/hospital to ensure you arrive on time. 2. If you are having surgery at [facility] or [facility] hospitals, you will need a copy of your medical history and physical form from your family physician within one month prior to the date of surgery. We will give you a form for your primary physician to complete.  3. We make every effort to accommodate the date you request for surgery.  However, there are times where surgery dates or times have to be moved.  We will contact you as soon as possible if a change in schedule is required.   4. No aspirin/ibuprofen for one week before surgery.  If you are on aspirin, any non-steroidal anti-inflammatory medications (Mobic, Aleve, Ibuprofen) should not be taken seven (7) days prior to your surgery.  You make take Tylenol for pain prior to surgery.  5. Medications - If you are taking daily heart and blood pressure medications, seizure, reflux, allergy, asthma, anxiety, pain or diabetes medications, make sure you notify the surgery center/hospital before the day of surgery so they can tell you which medications you should take or avoid the day of surgery. 6. No food or drink after midnight the night before surgery unless directed otherwise by surgical center/hospital staff. 7. No alcoholic beverages 24-hours prior to surgery.  No smoking 24-hours prior or 24-hours after  surgery. 8. Wear loose pants or shorts. They should be loose enough to fit over bandages, boots, and casts. 9. Don't wear slip-on shoes. Sneakers are preferred. 10. Bring your boot with you to the surgery center/hospital.  Also bring crutches or a walker if your physician has prescribed it for you.  If you do not have this equipment, it will be provided for you after surgery. 11. If you have not been contacted by the surgery center/hospital by the day before your surgery, call to confirm the date and time of your surgery. 12. Leave-time from work may vary depending on the type of surgery you have.  Appropriate arrangements should be made prior to surgery with your employer. 13. Prescriptions will be provided immediately following surgery by your doctor.  Fill these as soon as possible after surgery and take the medication as directed. Pain medications will not be refilled on weekends and must be approved by the doctor. 14. Remove nail polish on the operative foot and avoid getting pedicures prior to surgery. 15. Wash the night before surgery.  The night before surgery wash the foot and leg well with water and the antibacterial soap provided. Be sure to pay special attention to beneath the toenails and in between the toes.  Wash for at least three (3) minutes. Rinse thoroughly with water and dry well with a towel.  Perform this wash unless told not to do so by your physician.  Enclosed: 1 Ice pack (please put in freezer the night before surgery)   1 Hibiclens skin cleaner     Pre-op instructions  If you have any questions regarding the instructions, please do not hesitate to call our office.  [place]: [address], [place], [place] [address] -- [phone]  [place]: [address] [place], [place] [address] -- [phone]  [place]: [address]  [place], [place] [address] -- [phone]  [place]: [address], [address], [place], [place] [address] -- [phone]  Website:  [email or web address] 

## 2018-01-07 ENCOUNTER — Telehealth: Payer: Self-pay | Admitting: *Deleted

## 2018-01-07 NOTE — Telephone Encounter (Signed)
"  I'm calling to schedule my husband's surgery or to get a time because they already gave him a date of July 25.   He saw Dr. Logan BoresEvans yesterday."  Someone from the surgical center will call him a day or two prior to his surgery date.  They will  give him his arrival time.  "Oh okay, someone will call him the day before.  Can I give you an alternate number to call? Sometime he can't hear his phone ring at work.  I'm going to give you my cell number.  It is [phone]."

## 2018-01-08 NOTE — Progress Notes (Signed)
   Subjective: 47 year old male presenting today for follow up evaluation of plantar fasciitis of the left foot. He states she has been wearing the fascial brace and needs a new one. He is interested in scheduling surgical intervention at this time. Patient presents today for further treatment and evaluation.   Past Medical History:  Diagnosis Date  . Neuropathy      Objective: Physical Exam General: The patient is alert and oriented x3 in no acute distress.  Dermatology: Skin is warm, dry and supple bilateral lower extremities. Negative for open lesions or macerations bilateral.   Vascular: Dorsalis Pedis and Posterior Tibial pulses palpable bilateral.  Capillary fill time is immediate to all digits.  Neurological: Epicritic and protective threshold intact bilateral.   Musculoskeletal: Tenderness to palpation to the plantar aspect of the left heel along the plantar fascia. All other joints range of motion within normal limits bilateral. Strength 5/5 in all groups bilateral.    Assessment: 1. Plantar fasciitis left foot  Plan of Care:  1. Patient evaluated. Xrays reviewed.   2. Today we discussed the conservative versus surgical management of the presenting pathology. The patient opts for surgical management. All possible complications and details of the procedure were explained. All patient questions were answered. No guarantees were expressed or implied. 3. Authorization for surgery was initiated today. Surgery will consist of EPF left.  4. Return to clinic one week post op.   Just had a newborn baby at the beginning of June.    Felecia ShellingBrent M. Evans, DPM [facility]  Dr. Felecia ShellingBrent M. Evans, DPM    [address]                                     [place], [place] [address]                Office [phone]([phone]  Fax [phone]([phone]

## 2018-02-03 ENCOUNTER — Telehealth: Payer: Self-pay | Admitting: *Deleted

## 2018-02-03 NOTE — Telephone Encounter (Signed)
"  My husband is scheduled for surgery on Thursday.  At what time can he not eat?"  He cannot eat or drink anything after midnight.  "That long, his surgery isn't scheduled until 11:45 that morning?"  He can't have anything 6-8 hours prior to the surgery.  "So nothing eight hours prior to his surgery?"  Yes, that is correct.

## 2018-02-04 ENCOUNTER — Telehealth: Payer: Self-pay | Admitting: *Deleted

## 2018-02-04 NOTE — Telephone Encounter (Signed)
"  I am calling about my husband.  He is scheduled for surgery tomorrow.  He was previously given a time to be there.  Then he spoke to someone and they told him that someone would give him a call the day before and give him a time.  Can you give me his arrival time?"  You need to call the surgery center.  Their phone number is [phone].

## 2018-02-05 ENCOUNTER — Encounter: Payer: Self-pay | Admitting: Podiatry

## 2018-02-05 DIAGNOSIS — M722 Plantar fascial fibromatosis: Secondary | ICD-10-CM

## 2018-02-10 ENCOUNTER — Encounter: Payer: Self-pay | Admitting: Podiatry

## 2018-02-10 ENCOUNTER — Ambulatory Visit (INDEPENDENT_AMBULATORY_CARE_PROVIDER_SITE_OTHER): Payer: BLUE CROSS/BLUE SHIELD | Admitting: Podiatry

## 2018-02-10 DIAGNOSIS — Z9889 Other specified postprocedural states: Secondary | ICD-10-CM

## 2018-02-10 DIAGNOSIS — M722 Plantar fascial fibromatosis: Secondary | ICD-10-CM

## 2018-02-13 ENCOUNTER — Encounter: Payer: BLUE CROSS/BLUE SHIELD | Admitting: Podiatry

## 2018-02-15 NOTE — Progress Notes (Signed)
   Subjective:  Patient presents today status post EPF left. DOS: 02/05/18. He states he is doing well. He denies any significant pain or swelling. He only notes minimal bruising on the medial aspect of the left foot. He has been using the CAM boot as directed. Patient is here for further evaluation and treatment.    Past Medical History:  Diagnosis Date  . Neuropathy       Objective/Physical Exam Neurovascular status intact.  Skin incisions appear to be well coapted with sutures and staples intact. No sign of infectious process noted. No dehiscence. No active bleeding noted. Moderate edema noted to the surgical extremity.  Assessment: 1. s/p EPF surgery left. DOS: 02/05/18   Plan of Care:  1. Patient was evaluated. 2. Dressing changed.  3. Continue weightbearing in CAM boot.  4. Return to clinic in one week.    Felecia ShellingBrent M. Evans, DPM [facility]  Dr. Felecia ShellingBrent M. Evans, DPM    [address]                                        [place], [place] [address]                Office [phone]  Fax [phone](336) [phone]

## 2018-02-20 ENCOUNTER — Encounter: Payer: Self-pay | Admitting: Podiatry

## 2018-02-20 ENCOUNTER — Ambulatory Visit (INDEPENDENT_AMBULATORY_CARE_PROVIDER_SITE_OTHER): Payer: BLUE CROSS/BLUE SHIELD | Admitting: Podiatry

## 2018-02-20 DIAGNOSIS — Z9889 Other specified postprocedural states: Secondary | ICD-10-CM

## 2018-02-20 DIAGNOSIS — M722 Plantar fascial fibromatosis: Secondary | ICD-10-CM

## 2018-02-23 ENCOUNTER — Telehealth: Payer: Self-pay | Admitting: Podiatry

## 2018-02-23 NOTE — Progress Notes (Signed)
   Subjective:  Patient presents today status post EPF left. DOS: 02/05/18. He states he is doing well overall. He reports some continued intermittent pain. Patient is here for further evaluation and treatment.    Past Medical History:  Diagnosis Date  . Neuropathy       Objective/Physical Exam Neurovascular status intact.  Skin incisions appear to be well coapted with sutures and staples intact. No sign of infectious process noted. No dehiscence. No active bleeding noted. Moderate edema noted to the surgical extremity.  Assessment: 1. s/p EPF surgery left. DOS: 02/05/18   Plan of Care:  1. Patient was evaluated. 2. Sutures removed.  3. Transition into good shoe gear.  4. Note for work provided to return to work part time.  5. Return to clinic in 2 weeks.    Felecia ShellingBrent M. Edgard Debord, DPM [facility]  Dr. Felecia ShellingBrent M. Kathreen Dileo, DPM    [address]                                        [place], [place] [address]                Office [phone](336) [phone]  Fax [phone](336) [phone]

## 2018-02-23 NOTE — Telephone Encounter (Signed)
Pt had surgery and Dr. Logan BoresEvans told him he could come out of boot. Now, having excuciating pain and its shoots through my toes. Feels like a shock of electricity, just unbearable. Please give me a call after lunch.

## 2018-02-24 NOTE — Telephone Encounter (Signed)
I spoke with patient, he reports that his left 3,4,5 toes are having "electricuting" pains when he walks with his tennis shoes on.  He denies any pain any other times.  I informed him to go back into his boot for a couple of days to see if it calms down and try soaking in Epson salt.  He will let us know if the pains continue.  He verbalized understanding

## 2018-03-02 ENCOUNTER — Telehealth: Payer: Self-pay | Admitting: Podiatry

## 2018-03-02 NOTE — Telephone Encounter (Signed)
I'm calling for my husband. You can call back to my number, [phone]. He is still having some trouble with his foot.

## 2018-03-03 ENCOUNTER — Ambulatory Visit (INDEPENDENT_AMBULATORY_CARE_PROVIDER_SITE_OTHER): Payer: BLUE CROSS/BLUE SHIELD | Admitting: Podiatry

## 2018-03-03 ENCOUNTER — Other Ambulatory Visit: Payer: Self-pay

## 2018-03-03 ENCOUNTER — Encounter: Payer: Self-pay | Admitting: Podiatry

## 2018-03-03 ENCOUNTER — Telehealth: Payer: Self-pay | Admitting: Podiatry

## 2018-03-03 DIAGNOSIS — Z9889 Other specified postprocedural states: Secondary | ICD-10-CM

## 2018-03-03 DIAGNOSIS — M722 Plantar fascial fibromatosis: Secondary | ICD-10-CM

## 2018-03-03 MED ORDER — NONFORMULARY OR COMPOUNDED ITEM: 2 refills | Status: AC

## 2018-03-03 NOTE — Telephone Encounter (Signed)
Patient was seen in office today.  

## 2018-03-03 NOTE — Telephone Encounter (Signed)
Patient was seen today and was suppose to have medication sent to the pharmacy and they do not have it

## 2018-03-03 NOTE — Progress Notes (Signed)
d 

## 2018-03-03 NOTE — Telephone Encounter (Signed)
Patient notified that medication has been called into pharmacy and they will contact him when ready.

## 2018-03-05 NOTE — Progress Notes (Signed)
   Subjective:  Patient presents today status post EPF left. DOS: 02/05/18. He reports continued sharp pain in toes 3-5 of the left foot. He reports associated tingling. He denies any pain around the incision site. He has been taking the pain medicine for treatment of the symptoms. Patient is here for further evaluation and treatment.   Past Medical History:  Diagnosis Date  . Neuropathy       Objective/Physical Exam Neurovascular status intact.  Skin incisions appear to be well coapted. No sign of infectious process noted. No dehiscence. No active bleeding noted. Moderate edema noted to the surgical extremity.  Assessment: 1. s/p EPF surgery left. DOS: 02/05/18 2. Neuritis left foot    Plan of Care:  1. Patient was evaluated. 2. Prescription for neuropathy pain cream to be dispensed by [facility].  3. Continue taking Lyrica as prescribed by PCP.  4. Continue wearing good shoe gear.  5. Return to clinic in 4 weeks.    Felecia ShellingBrent M. Cherril Hett, DPM [facility]  Dr. Felecia ShellingBrent M. Harmon Bommarito, DPM    [address]                                        [place], [place] [address]                Office [phone]([phone]  Fax [phone](336) [phone]

## 2018-03-06 ENCOUNTER — Encounter: Payer: BLUE CROSS/BLUE SHIELD | Admitting: Podiatry

## 2018-03-10 ENCOUNTER — Ambulatory Visit (INDEPENDENT_AMBULATORY_CARE_PROVIDER_SITE_OTHER): Payer: BLUE CROSS/BLUE SHIELD | Admitting: Podiatry

## 2018-03-10 ENCOUNTER — Encounter: Payer: Self-pay | Admitting: Podiatry

## 2018-03-10 DIAGNOSIS — G5792 Unspecified mononeuropathy of left lower limb: Secondary | ICD-10-CM

## 2018-03-12 ENCOUNTER — Telehealth: Payer: Self-pay

## 2018-03-12 NOTE — Telephone Encounter (Signed)
Gunnar FusiPaula from [facility] called and stated that she contacted patient for appt and patient declined and said he did not want to have the NCS test done.

## 2018-03-12 NOTE — Progress Notes (Signed)
   Subjective:  Patient presents today status post EPF left. DOS: 02/05/18. He reports continued throbbing pain. He states the neuropathy cream is not helping. There are no modifying factors noted. Patient is here for further evaluation and treatment.   Past Medical History:  Diagnosis Date  . Neuropathy       Objective/Physical Exam Neurovascular status intact.  Skin incisions appear to be well coapted. No sign of infectious process noted. No dehiscence. No active bleeding noted. Moderate edema noted to the surgical extremity.  Assessment: 1. s/p EPF surgery left. DOS: 02/05/18 2. Neuritis left foot    Plan of Care:  1. Patient was evaluated. 2. Order placed for nerve conduction study of the left lower extremity.   3. Continue taking Lyrica as prescribed by PCP.  4. Continue wearing good shoe gear.  5. Discontinue using [employer] neuropathy cream.  6. Continue taking Percocet 7.5/325 mg from PCP.  7. Return to clinic in 3 weeks.    Felecia ShellingBrent M. Evans, DPM [facility]  Dr. Felecia ShellingBrent M. Evans, DPM    [address]                                        [place], [place] [address]                Office [phone]  Fax [phone]) [phone]

## 2018-03-12 NOTE — Telephone Encounter (Signed)
-----   Message from Felecia ShellingBrent M Evans, DPM sent at 03/10/2018  4:06 PM EDT ----- Regarding: nerve conduction study  Please order nerve conduction study left lower extremity.  Dx: neuritis left foot  Thanks, Dr. Logan BoresEvans

## 2018-03-31 ENCOUNTER — Encounter: Payer: BLUE CROSS/BLUE SHIELD | Admitting: Podiatry

## 2018-04-01 NOTE — Progress Notes (Signed)
DOS: 02-05-2018 Endoscopic plantar Fasciotomy Left  [facility]

## 2018-04-03 ENCOUNTER — Encounter: Payer: BLUE CROSS/BLUE SHIELD | Admitting: Podiatry

## 2018-04-08 ENCOUNTER — Other Ambulatory Visit: Payer: Self-pay | Admitting: Internal Medicine

## 2018-04-08 DIAGNOSIS — R100 Acute abdomen: Secondary | ICD-10-CM

## 2018-04-09 ENCOUNTER — Ambulatory Visit
Admission: RE | Admit: 2018-04-09 | Discharge: 2018-04-09 | Disposition: A | Discharge status: Home / Self Care | Payer: BLUE CROSS/BLUE SHIELD | Source: Ambulatory Visit | Attending: Internal Medicine | Admitting: Internal Medicine

## 2018-04-09 DIAGNOSIS — K429 Umbilical hernia without obstruction or gangrene: Secondary | ICD-10-CM | POA: Diagnosis not present

## 2018-04-09 DIAGNOSIS — K760 Fatty (change of) liver, not elsewhere classified: Secondary | ICD-10-CM | POA: Insufficient documentation

## 2018-04-09 DIAGNOSIS — I700 Atherosclerosis of aorta: Secondary | ICD-10-CM | POA: Diagnosis not present

## 2018-04-09 DIAGNOSIS — R100 Acute abdomen: Secondary | ICD-10-CM

## 2018-04-09 HISTORY — DX: Essential (primary) hypertension: I10

## 2018-04-09 LAB — POCT I-STAT CREATININE: Creatinine, Ser: 0.8 mg/dL (ref 0.61–1.24)

## 2018-04-09 MED ORDER — IOPAMIDOL (ISOVUE-300) INJECTION 61%
100.0000 mL | Freq: Once | INTRAVENOUS | Status: AC | PRN
  Administered 2018-04-09: 100 mL via INTRAVENOUS

## 2018-04-10 ENCOUNTER — Other Ambulatory Visit: Payer: Self-pay | Admitting: Internal Medicine

## 2018-04-10 DIAGNOSIS — R100 Acute abdomen: Secondary | ICD-10-CM

## 2018-04-10 DIAGNOSIS — R1032 Left lower quadrant pain: Secondary | ICD-10-CM

## 2018-04-14 ENCOUNTER — Ambulatory Visit
Admission: RE | Admit: 2018-04-14 | Discharge: 2018-04-14 | Disposition: A | Discharge status: Home / Self Care | Payer: BLUE CROSS/BLUE SHIELD | Source: Ambulatory Visit | Attending: Internal Medicine | Admitting: Internal Medicine

## 2018-04-14 DIAGNOSIS — K828 Other specified diseases of gallbladder: Secondary | ICD-10-CM | POA: Insufficient documentation

## 2018-04-14 DIAGNOSIS — R1032 Left lower quadrant pain: Secondary | ICD-10-CM | POA: Insufficient documentation

## 2018-04-14 DIAGNOSIS — R100 Acute abdomen: Secondary | ICD-10-CM

## 2018-05-19 ENCOUNTER — Encounter: Payer: Self-pay | Admitting: Infectious Diseases

## 2018-05-19 ENCOUNTER — Ambulatory Visit: Payer: BLUE CROSS/BLUE SHIELD | Attending: Infectious Diseases | Admitting: Infectious Diseases

## 2018-05-19 ENCOUNTER — Other Ambulatory Visit
Admission: RE | Admit: 2018-05-19 | Discharge: 2018-05-19 | Disposition: A | Discharge status: Home / Self Care | Payer: BLUE CROSS/BLUE SHIELD | Source: Ambulatory Visit | Attending: Infectious Diseases | Admitting: Infectious Diseases

## 2018-05-19 VITALS — BP 141/80 | HR 77 | Temp 97.7°F | Wt 240.0 lb

## 2018-05-19 DIAGNOSIS — L304 Erythema intertrigo: Secondary | ICD-10-CM | POA: Diagnosis not present

## 2018-05-19 DIAGNOSIS — L739 Follicular disorder, unspecified: Secondary | ICD-10-CM

## 2018-05-19 DIAGNOSIS — I10 Essential (primary) hypertension: Secondary | ICD-10-CM | POA: Diagnosis not present

## 2018-05-19 DIAGNOSIS — Z888 Allergy status to other drugs, medicaments and biological substances status: Secondary | ICD-10-CM

## 2018-05-19 DIAGNOSIS — Z885 Allergy status to narcotic agent status: Secondary | ICD-10-CM

## 2018-05-19 DIAGNOSIS — Z87891 Personal history of nicotine dependence: Secondary | ICD-10-CM

## 2018-05-19 DIAGNOSIS — Z79899 Other long term (current) drug therapy: Secondary | ICD-10-CM

## 2018-05-19 DIAGNOSIS — H00011 Hordeolum externum right upper eyelid: Secondary | ICD-10-CM | POA: Diagnosis not present

## 2018-05-19 DIAGNOSIS — G629 Polyneuropathy, unspecified: Secondary | ICD-10-CM

## 2018-05-19 NOTE — Patient Instructions (Signed)
  i. Keep draining wounds covered with clean, dry bandages .  ii. Maintain good personal hygiene with regular bathing and cleaning of hands with soap and water or an alcohol-based hand gel, particularly after touching infected skin or an item that has directly contacted a draining wound   iii. Avoid reusing or sharing personal items (eg, disposable razors, linens, and towels) that have contacted infected skin   . Environmental hygiene measures should be considered in patients with recurrent SSTI in the household or community setting:   Focus cleaning efforts on high-touch surfaces (ie, surfaces that come into frequent contact with people's bare skin each day, such as counters, door knobs, bath tubs, and toilet seats) that may contact bare skin or uncovered infections   Commercially available cleaners or detergents appropriate for the surface being cleaned should be used according to label instructions for routine cleaning of surfaces  --------------------------------------------------------------------------------------------- . Marland Kitchen Nasal decolonization with mupirocin twice daily for 5-10 days  ? ii. Nasal decolonization with mupirocin twice daily for 5-10 days and topical body decolonization regimens with a skin antiseptic solution (eg, chlorhexidine) for 5-14 days or dilute bleach baths. (For dilute bleach baths, 1 teaspoon per gallon of water [or  cup per  tub or 13 gallons of water] given for 15 min twice weekly for ~3 months can be considered.)  16. Oral antimicrobial therapy is recommended for the treatment of active infection only and is not routinely recommended for decolonization. An oral agent in combination with rifampin, if the strain is susceptible, may be considered for decolonization if infections recur despite above measures  17. In cases where household or interpersonal transmission is suspected:  ? i. Personal and environmental hygiene measures in the patient and contacts are  recommended  ? ii. Contacts should be evaluated for evidence of S. aureus infection: ? a. Symptomatic contacts should be evaluated and treated ; nasal and topical body decolonization strategies may be considered following treatment of active infection  ? b. Nasal and topical body decolonization of asymptomatic household contacts may be considered

## 2018-05-19 NOTE — Progress Notes (Signed)
NAME: Tommy Jacobs  DOB: [birth year]  MRN: [id number]  Date/Time: 05/19/2018 12:34 PM Subjective:  REASON FOR CONSULT:  ? Tommy Jacobs is a 47 y.o. male with a history of HTN, peripheral neuropathy,  Is here for recurrent folliculitis/boils for the past 2 year.has taken multiple courses of Doxy. Recently had an infection rt eye ( external hordeolum) and is getting topical antibiotic is referred to me for these infections.  Pt is not a diabetic but says he has gained lot of weight and sweats a lot. He is concerned that his new born daughter had a lesion in her nose and was told it was impetigo and probably he gave it to her . 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 Hypertension   . Neuropathy    gout Past Surgical History:  Procedure Laterality Date  . HEMORRHOID SURGERY     left plantar fascitis surgery Social History   Socioeconomic History  . Marital status: Married    Spouse name: Not on file  . Number of children: Not on file  . Years of education: Not on file  . Highest education level: Not on file  Occupational History  . Not on file  Social Needs  . Financial resource strain: Not on file  . Food insecurity:    Worry: Not on file    Inability: Not on file  . Transportation needs:    Medical: Not on file    Non-medical: Not on file  Tobacco Use  . Smoking status: Former Smoker    Types: Cigarettes  . Smokeless tobacco: Never Used  Substance and Sexual Activity  . Alcohol use: No    Comment: former drinker  . Drug use: Not on file  . Sexual activity: Not on file  Lifestyle  . Physical activity:    Days per week: Not on file    Minutes per session: Not on file  . Stress: Not on file  Relationships  .  Social connections:    Talks on phone: Not on file    Gets together: Not on file    Attends religious service: Not on file    Active member of club or organization: Not on file    Attends meetings of clubs or organizations: Not on file    Relationship status: Not on file  . Intimate partner violence:    Fear of current or ex partner: Not on file    Emotionally abused: Not on file    Physically abused: Not on file    Forced sexual activity: Not on file  Other Topics Concern  . Not on file  Social History Narrative  . Not on file    No family history on file. Allergies  Allergen Reactions  . Codeine   . Diphenhydramine    Current Outpatient Medications  Medication Sig Dispense Refill  . allopurinol (ZYLOPRIM) 100 MG tablet Take 100 mg by mouth daily.  3  . ALPRAZolam (XANAX) 1 MG tablet TAKE 1-2 TABS BY MOUTH AT BEDTIME AS NEEDED  3  . HYDROcodone-acetaminophen (NORCO) 5-325 MG tablet Take 1-2 tablets by mouth every 4 (four) hours as needed for moderate pain. 15 tablet 0  . ibuprofen (ADVIL,MOTRIN) 800  MG tablet Take 1 tablet (800 mg total) by mouth every 8 (eight) hours as needed. 30 tablet 0  . lisinopril (PRINIVIL,ZESTRIL) 10 MG tablet Take 10 mg by mouth daily.    . meloxicam (MOBIC) 15 MG tablet Take 15 mg by mouth daily.  0  . NONFORMULARY OR COMPOUNDED ITEM See pharmacy note 120 each 2  . oxyCODONE-acetaminophen (PERCOCET) 7.5-325 MG tablet Take 1 tablet by mouth 2 (two) times daily as needed. for pain  0  . pregabalin (LYRICA) 300 MG capsule Take 300 mg by mouth daily.     No current facility-administered medications for this visit.      Abtx:  Anti-infectives (From admission, onward)   None      REVIEW OF SYSTEMS:  Const: negative fever, negative chills, negative weight loss Eyes: negative diplopia or visual changes, has rt upper lid lesion and sone p eye pain ENT: negative coryza, negative sore throat Resp: negative cough, hemoptysis, dyspnea Cards: negative  for chest pain, palpitations, lower extremity edema GU: negative for frequency, dysuria and hematuria GI: Negative for abdominal apin, diarrhea, bleeding, constipation Skin: recurrent skin lesions          Heme: negative for easy bruising and gum/nose bleeding MS: negative for myalgias, arthralgias, back pain and muscle weakness Neurolo:negative for headaches, dizziness, vertigo, memory problems, has left foot shooting pain  Psych: negative for feelings of anxiety, depression  Endocrine: no polyuria or polydipsia Allergy/Immunology- negative for any medication or food allergies except codeine ? Pertinent Positives include : Objective:  VITALS:  BP (!) 141/80 (BP Location: Left Arm, Patient Position: Sitting, Cuff Size: Large)   Pulse 77   Temp 97.7 F (36.5 C) (Oral)   Wt 240 lb (108.9 kg)   BMI 33.01 kg/m  PHYSICAL EXAM:  General: Alert, cooperative, no distress, appears stated age.  Head: Normocephalic, without obvious abnormality, atraumatic. Eyes: Conjunctivae clear, anicteric sclerae. Pupils are equal, rt upper lid- small hordeolum externum ENT Nares normal. No drainage or sinus tenderness. Lips, mucosa, and tongue normal. No Thrush Neck: Supple, symmetrical, no adenopathy, thyroid: non tender no carotid bruit and no JVD. Back: No CVA tenderness. Lungs: Clear to auscultation bilaterally. No Wheezing or Rhonchi. No rales. Heart: Regular rate and rhythm, no murmur, rub or gallop. Abdomen: Soft, non-tender,not distended. Bowel sounds normal. No masses Extremities: atraumatic, no cyanosis. No edema. No clubbing Skin: rt inguinal area- intertrigo , hyperpigmented spots- a few scattered Lymph: Cervical, supraclavicular normal. Neurologic: Grossly non-focal Pertinent Labs Lab Results none      Microbiology:none  IMAGING RESULTS: ?none                        Impression/Recommendation ?47 y.o. male with a history of HTN, peripheral neuropathy,  Is here for recurrent  folliculitis/boils for the past 2 year.has taken multiple courses of Doxy. Recently had an infection rt eye ( external hordeolum) and is getting topical antibiotic is referred to me for these infections. ? Recurrent folliculitis/boils- none active now. Never had a culture- not sure whether this is MRSA or MSSA- never had I/d Will do nares, inguinal surveillance culture for staph- if present will decolonize with mupirocin and chlorhexidine baths. May need dual antibiotic including rifampin if MRSA in the culture  ?HTN on lisionpril  Peripheral neuropathy-on lyrica ___________________________________________________ Discussed with patient, in detail- gave him information on personal and environmental hygiene

## 2018-05-20 LAB — MRSA CULTURE: Culture: NOT DETECTED

## 2018-05-21 ENCOUNTER — Telehealth: Payer: Self-pay | Admitting: Licensed Clinical Social Worker

## 2018-05-21 ENCOUNTER — Telehealth: Payer: Self-pay | Admitting: Infectious Diseases

## 2018-05-21 NOTE — Telephone Encounter (Signed)
Patient called stating that the area on his eye has gotten worse, slight pain is now closed.  Patient has called twice. Please advise.

## 2018-05-21 NOTE — Telephone Encounter (Signed)
Returned patient call as he was having more swelling to the rt eye. He went to ophthalmologist and was given doxy and steroid drops. His surveillance culture from groin showing staph aureus, will wait for final ID an sensi and manage accordingly Will call him with the final result

## 2018-05-22 ENCOUNTER — Telehealth: Payer: Self-pay | Admitting: Infectious Diseases

## 2018-05-22 ENCOUNTER — Other Ambulatory Visit: Payer: Self-pay | Admitting: Infectious Diseases

## 2018-05-22 DIAGNOSIS — A4902 Methicillin resistant Staphylococcus aureus infection, unspecified site: Secondary | ICD-10-CM

## 2018-05-22 LAB — AEROBIC CULTURE W GRAM STAIN (SUPERFICIAL SPECIMEN)

## 2018-05-22 LAB — AEROBIC CULTURE  (SUPERFICIAL SPECIMEN)

## 2018-05-22 MED ORDER — SULFAMETHOXAZOLE-TRIMETHOPRIM 800-160 MG PO TABS: 1.0000 | ORAL_TABLET | Freq: Two times a day (BID) | ORAL | 0 refills | Status: DC

## 2018-05-22 NOTE — Telephone Encounter (Signed)
Spoke to patient and gave him the surveillance culture report which was MRSA- sent bactrim DS BID to the pharmacy- asked him to stop doxy. HE will need decolonization after he completes the treatment

## 2018-06-04 ENCOUNTER — Telehealth: Payer: Self-pay | Admitting: *Deleted

## 2018-06-04 NOTE — Telephone Encounter (Signed)
Patient seen by Dr Rivka Saferavishankar, has question about second antibiotic.  He states the bumps on his eye have gone away, but still has 1 on his left eye and now has 2 bumps on the corner of his mouth. He is concerned because he has a new baby in the home. [facility] ID. Andree CossHowell, Michelle M, RN

## 2018-06-04 NOTE — Telephone Encounter (Signed)
-----   Message from DeweyJohaura Celedonio sent at 06/04/2018 11:40 AM EST ----- Contact: [phone] Patient called saying that he was supposed to get a second antibiotic and he hasn't receive it yet

## 2018-06-05 ENCOUNTER — Other Ambulatory Visit: Payer: Self-pay | Admitting: Infectious Diseases

## 2018-06-05 MED ORDER — MUPIROCIN 2 % EX OINT: 1.0000 "application " | TOPICAL_OINTMENT | Freq: Two times a day (BID) | CUTANEOUS | 0 refills | Status: AC

## 2018-06-05 MED ORDER — CHLORHEXIDINE GLUCONATE 4 % EX LIQD: Freq: Every day | CUTANEOUS | 0 refills | Status: AC

## 2018-06-05 MED ORDER — RIFAMPIN 300 MG PO CAPS: 300.0000 mg | ORAL_CAPSULE | Freq: Two times a day (BID) | ORAL | 0 refills | Status: AC

## 2018-06-05 MED ORDER — SULFAMETHOXAZOLE-TRIMETHOPRIM 800-160 MG PO TABS: 1.0000 | ORAL_TABLET | Freq: Two times a day (BID) | ORAL | 0 refills | Status: DC

## 2018-06-05 NOTE — Progress Notes (Signed)
Called patient and asked him to do decolonization for MRSA with bactrim+ rifampin+mupirocin + chlorhexidine

## 2018-06-08 ENCOUNTER — Telehealth: Payer: Self-pay | Admitting: Licensed Clinical Social Worker

## 2018-06-08 NOTE — Telephone Encounter (Signed)
Patient called left voicemail on Friday about a problem his wife is having and he thinks it may be coming from him. I returned call but was unable to speak with him or leave message.

## 2018-06-16 ENCOUNTER — Telehealth: Payer: Self-pay | Admitting: Infectious Diseases

## 2018-06-16 ENCOUNTER — Other Ambulatory Visit: Payer: Self-pay | Admitting: Infectious Diseases

## 2018-06-16 DIAGNOSIS — Z889 Allergy status to unspecified drugs, medicaments and biological substances status: Secondary | ICD-10-CM

## 2018-06-16 NOTE — Telephone Encounter (Signed)
Pt called saying the rifampin and bactrim together given for treatment of MRSA and decolonization caused him symptoms like flu, body ache and he stopped after 4 days last Thursday. He said that his urine stream was small. He had called his PCP's office and got an appt for Thursday. I asked him to come and get blood work to check his kidney and liver and cbc.He wanted to get his labs done at the [facility]. I will call his PCP's office and ask them to order the labs before his appt

## 2019-01-27 ENCOUNTER — Encounter: Payer: Self-pay | Admitting: Emergency Medicine

## 2019-01-27 ENCOUNTER — Other Ambulatory Visit: Payer: Self-pay

## 2019-01-27 ENCOUNTER — Ambulatory Visit
Admission: EM | Admit: 2019-01-27 | Discharge: 2019-01-27 | Disposition: A | Discharge status: Home / Self Care | Payer: BLUE CROSS/BLUE SHIELD | Attending: Family Medicine | Admitting: Family Medicine

## 2019-01-27 DIAGNOSIS — J340 Abscess, furuncle and carbuncle of nose: Secondary | ICD-10-CM

## 2019-01-27 MED ORDER — SULFAMETHOXAZOLE-TRIMETHOPRIM 800-160 MG PO TABS: 1.0000 | ORAL_TABLET | Freq: Two times a day (BID) | ORAL | 0 refills | Status: AC

## 2019-01-27 MED ORDER — SULFAMETHOXAZOLE-TRIMETHOPRIM 800-160 MG PO TABS: 1.0000 | ORAL_TABLET | Freq: Two times a day (BID) | ORAL | 0 refills | Status: DC

## 2019-01-27 NOTE — ED Provider Notes (Signed)
[facility]    CSN: [id number] Arrival date & time: 01/27/19  1301     History   Chief Complaint Chief Complaint  Patient presents with  . Abscess    nose    HPI Tommy Jacobs is a 48 y.o. male.   48 yo male with a c/o an abscess on the tip of his nose. States area is red. Denies any drainage. States he started taking some doxycycline he had at home for the past 2 days, but has not seen improvement and the doxycycline is causing nausea plus upset stomach.    Abscess   Past Medical History:  Diagnosis Date  . Hypertension   . Neuropathy     Patient Active Problem List   Diagnosis Date Noted  . Benign essential hypertension 01/22/2016  . Swelling of right knee joint 01/17/2016    Past Surgical History:  Procedure Laterality Date  . HEMORRHOID SURGERY         Home Medications    Prior to Admission medications   Medication Sig Start Date End Date Taking? Authorizing Provider  allopurinol (ZYLOPRIM) 100 MG tablet Take 100 mg by mouth daily. 01/27/18  Yes [provider]  ALPRAZolam (XANAX) 1 MG tablet TAKE 1-2 TABS BY MOUTH AT BEDTIME AS NEEDED 01/27/18  Yes [provider]  lisinopril (PRINIVIL,ZESTRIL) 10 MG tablet Take 10 mg by mouth daily.   Yes [provider]  oxyCODONE-acetaminophen (PERCOCET) 7.5-325 MG tablet Take 1 tablet by mouth 2 (two) times daily as needed. for pain 02/17/18  Yes [provider]  HYDROcodone-acetaminophen (NORCO) 5-325 MG tablet Take 1-2 tablets by mouth every 4 (four) hours as needed for moderate pain. 05/24/15   Beers, Pierce Crane, PA-C  ibuprofen (ADVIL,MOTRIN) 800 MG tablet Take 1 tablet (800 mg total) by mouth every 8 (eight) hours as needed. 05/24/15   Beers, Pierce Crane, PA-C  meloxicam (MOBIC) 15 MG tablet Take 15 mg by mouth daily. 02/05/18   [provider]  mupirocin ointment (BACTROBAN) 2 % Place 1 application into the nose 2 (two) times daily. 06/05/18   Tsosie Billing, MD  NONFORMULARY OR COMPOUNDED ITEM See pharmacy note 03/03/18   Edrick Kins, DPM  pregabalin (LYRICA) 300 MG capsule Take 300 mg by mouth daily.    [provider]  rifampin (RIFADIN) 300 MG capsule Take 1 capsule (300 mg total) by mouth 2 (two) times daily. 06/05/18   Tsosie Billing, MD  sulfamethoxazole-trimethoprim (BACTRIM DS) 800-160 MG tablet Take 1 tablet by mouth 2 (two) times daily. 01/27/19   Norval Gable, MD    Family History History reviewed. No pertinent family history.  Social History Social History   Tobacco Use  . Smoking status: Former Smoker    Types: Cigarettes  . Smokeless tobacco: Never Used  Substance Use Topics  . Alcohol use: No    Comment: former drinker  . Drug use: Not on file     Allergies   Codeine and Diphenhydramine   Review of Systems Review of Systems   Physical Exam Triage Vital Signs ED Triage Vitals  Enc Vitals Group     BP 01/27/19 1333 (!) 141/98     Pulse Rate 01/27/19 1333 77     Resp 01/27/19 1333 16     Temp 01/27/19 1333 98.3 F (36.8 C)     Temp Source 01/27/19 1333 Oral     SpO2 01/27/19 1333 98 %     Weight 01/27/19 1332 225 lb (  102.1 kg)     Height 01/27/19 1332 5\' 11"  (1.803 m)     Head Circumference --      Peak Flow --      Pain Score 01/27/19 1332 10     Pain Loc --      Pain Edu? --      Excl. in GC? --    No data found.  Updated Vital Signs BP (!) 141/98 (BP Location: Left Arm)   Pulse 77   Temp 98.3 F (36.8 C) (Oral)   Resp 16   Ht 5\' 11"  (1.803 m)   Wt 102.1 kg   SpO2 98%   BMI 31.38 kg/m   Visual Acuity Right Eye Distance:   Left Eye Distance:   Bilateral Distance:    Right Eye Near:   Left Eye Near:    Bilateral Near:     Physical Exam Vitals signs and nursing note reviewed.  Constitutional:      General: He is not in acute distress.    Appearance: He is not toxic-appearing or diaphoretic.  Skin:    Comments: Pinpoint pustular lesion on skin at tip  of nose with 2.5cm area of surrounding blanchable erythema, warmth and tenderness  Neurological:     Mental Status: He is alert.      UC Treatments / Results  Labs (all labs ordered are listed, but only abnormal results are displayed) Labs Reviewed - No data to display  EKG   Radiology No results found.  Procedures Procedures (including critical care time)  Medications Ordered in UC Medications - No data to display  Initial Impression / Assessment and Plan / UC Course  I have reviewed the triage vital signs and the nursing notes.  Pertinent labs & imaging results that were available during my care of the patient were reviewed by me and considered in my medical decision making (see chart for details).      Final Clinical Impressions(s) / UC Diagnoses   Final diagnoses:  Cellulitis of external nose     Discharge Instructions     Warm compresses to area    ED Prescriptions    Medication Sig Dispense Auth. Provider   sulfamethoxazole-trimethoprim (BACTRIM DS) 800-160 MG tablet  (Status: Discontinued) Take 1 tablet by mouth 2 (two) times daily. 20 tablet Payton Mccallumonty, Delise Simenson, MD   sulfamethoxazole-trimethoprim (BACTRIM DS) 800-160 MG tablet Take 1 tablet by mouth 2 (two) times daily. 20 tablet Payton Mccallumonty, Luretta Everly, MD      1. diagnosis reviewed with patient 2. rx as per orders above; reviewed possible side effects, interactions, risks and benefits  3. Recommend supportive treatment as above  4. Follow-up prn if symptoms worsen or don't improve   Controlled Substance Prescriptions [place] Controlled Substance Registry consulted? Not Applicable   Payton Mccallumonty, Ady Heimann, MD 01/27/19 1440

## 2019-01-27 NOTE — ED Triage Notes (Signed)
Patient c/o abscess on the tip of his nose for the past 3 days.  Patient took some Doxycycline (old prescription) for the past couple of days.

## 2019-01-27 NOTE — Discharge Instructions (Addendum)
Warm compresses to area °

## 2020-03-28 DIAGNOSIS — M79676 Pain in unspecified toe(s): Secondary | ICD-10-CM

## 2020-04-28 ENCOUNTER — Other Ambulatory Visit: Payer: Self-pay | Admitting: Internal Medicine

## 2020-04-28 DIAGNOSIS — R222 Localized swelling, mass and lump, trunk: Secondary | ICD-10-CM

## 2020-04-28 DIAGNOSIS — R0789 Other chest pain: Secondary | ICD-10-CM

## 2020-05-08 ENCOUNTER — Ambulatory Visit
Admission: RE | Admit: 2020-05-08 | Discharge: 2020-05-08 | Disposition: A | Discharge status: Home / Self Care | Payer: Self-pay | Source: Ambulatory Visit | Attending: Internal Medicine | Admitting: Internal Medicine

## 2020-05-08 ENCOUNTER — Other Ambulatory Visit: Payer: Self-pay

## 2020-05-08 ENCOUNTER — Encounter (INDEPENDENT_AMBULATORY_CARE_PROVIDER_SITE_OTHER): Payer: Self-pay

## 2020-05-08 DIAGNOSIS — R0789 Other chest pain: Secondary | ICD-10-CM | POA: Insufficient documentation

## 2020-05-08 DIAGNOSIS — R222 Localized swelling, mass and lump, trunk: Secondary | ICD-10-CM | POA: Insufficient documentation

## 2020-10-06 ENCOUNTER — Other Ambulatory Visit: Payer: Self-pay | Admitting: Internal Medicine

## 2020-10-06 ENCOUNTER — Other Ambulatory Visit (HOSPITAL_COMMUNITY): Payer: Self-pay | Admitting: Internal Medicine

## 2020-10-06 DIAGNOSIS — R222 Localized swelling, mass and lump, trunk: Secondary | ICD-10-CM

## 2020-10-18 ENCOUNTER — Ambulatory Visit: Payer: Self-pay

## 2020-12-31 IMAGING — CT CT CHEST W/O CM
1 of 2 series · 14 of 30 positions shown, 18 images · non-contrast
Comparison: April 09, 2018.

CLINICAL DATA: Chest pain.

EXAM:
CT CHEST WITHOUT CONTRAST
TECHNIQUE: Multidetector CT imaging of the chest was performed following the
standard protocol without IV contrast.

[Series 2: thorax · axial · 0.70mm/px · z∈[-368,-78]mm · 14 of 171 slices shown, 18 images]
[im 13/171  mediastinal]
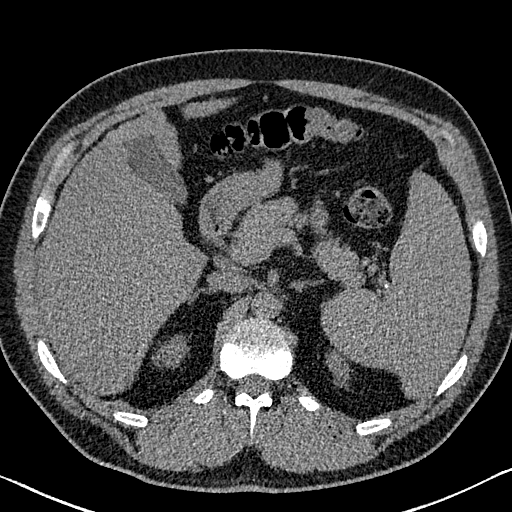
[im 13/171  lung]
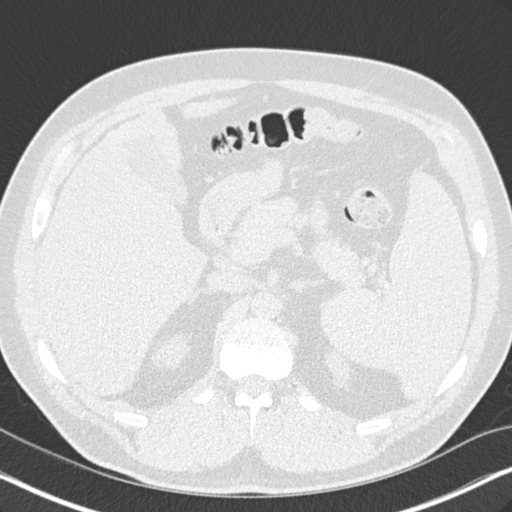
[im 25/171  lung]
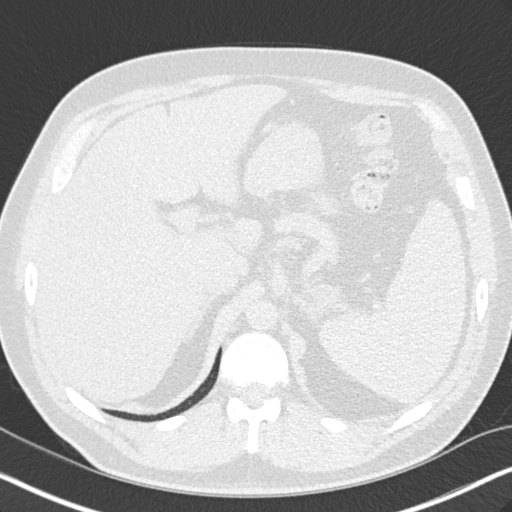
[im 37/171  lung]
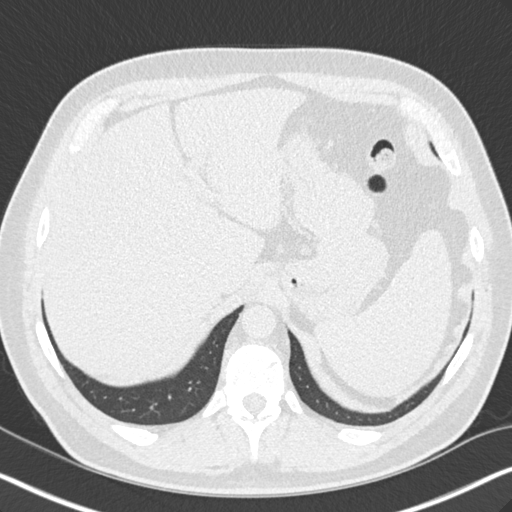
[im 49/171  lung]
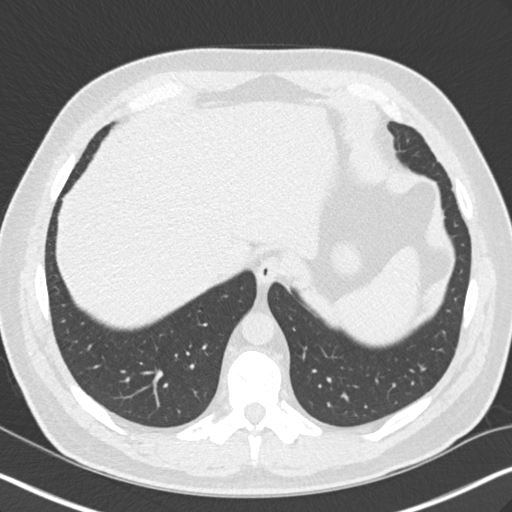
[im 61/171  mediastinal]
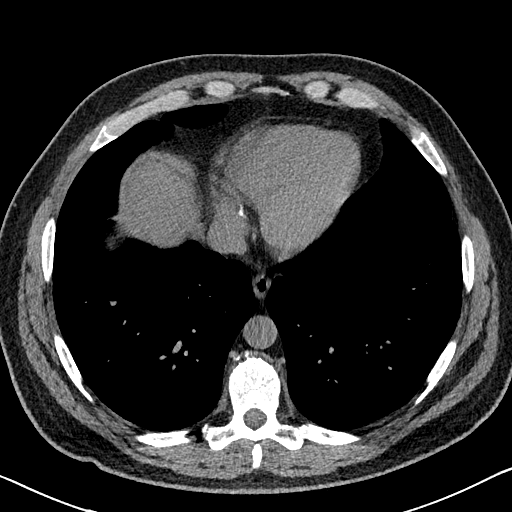
[im 61/171  lung]
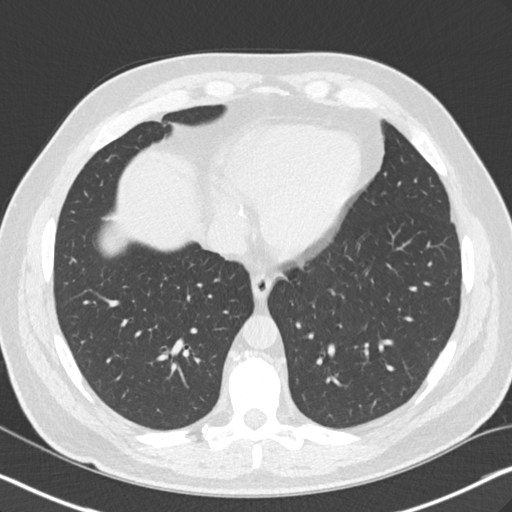
[im 73/171  lung]
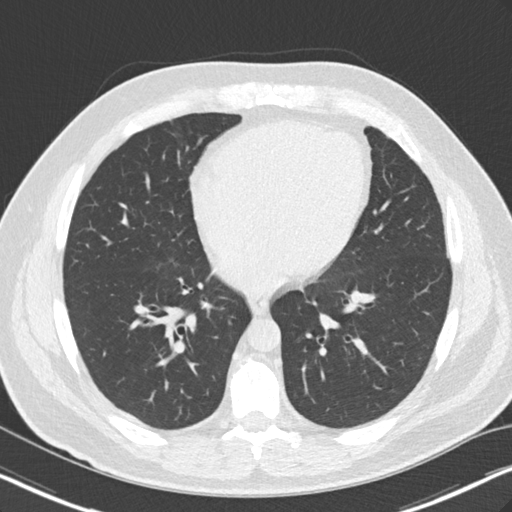
[im 81/171  lung]
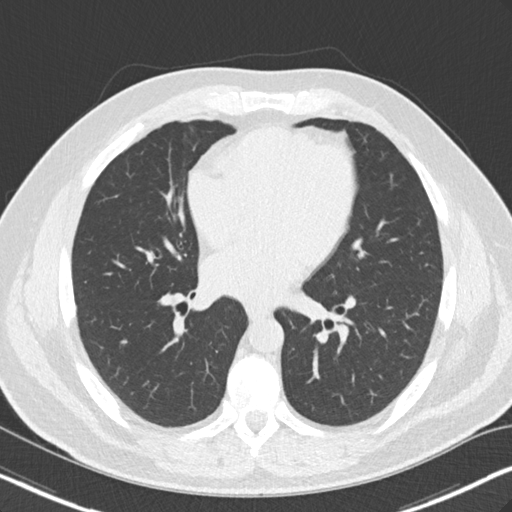
[im 86/171  lung]
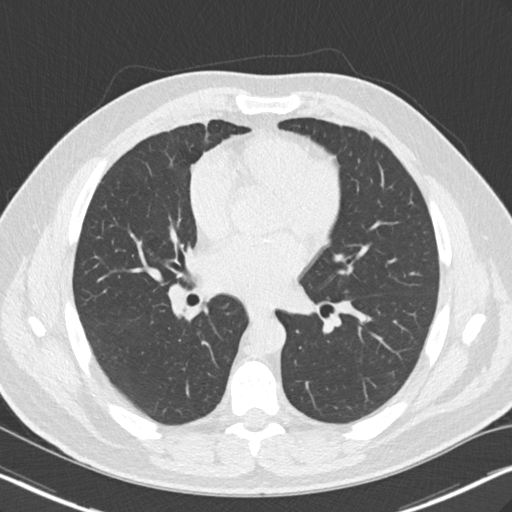
[im 98/171  mediastinal]
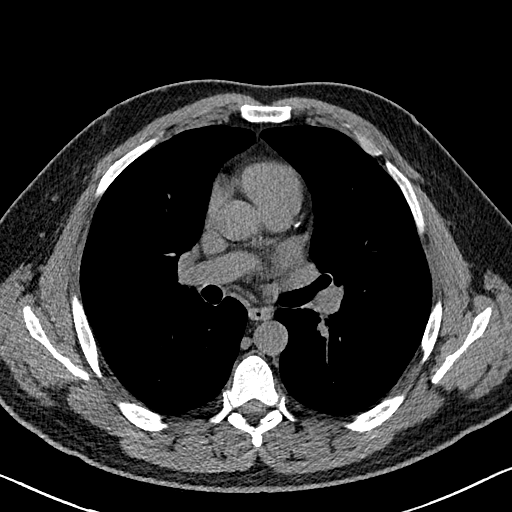
[im 98/171  lung]
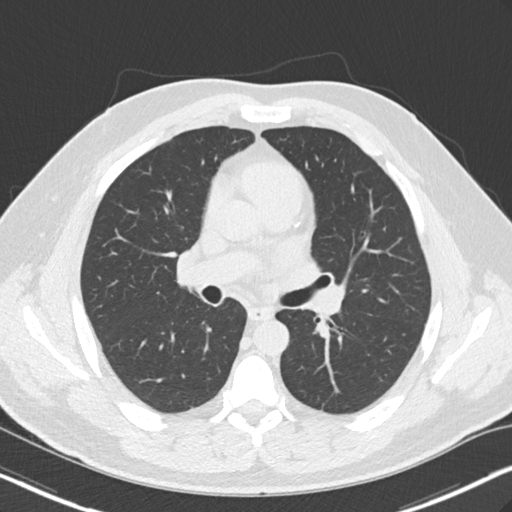
[im 110/171  lung]
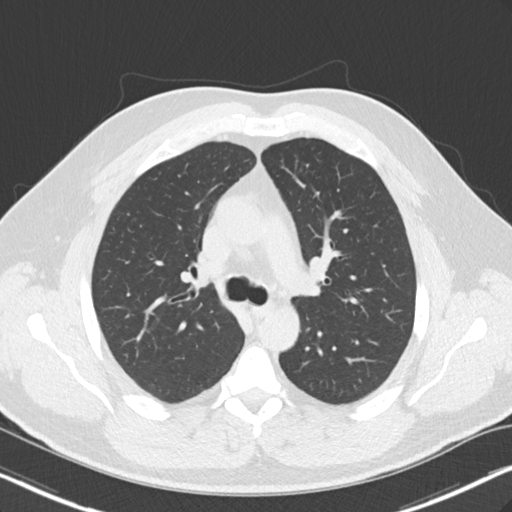
[im 122/171  lung]
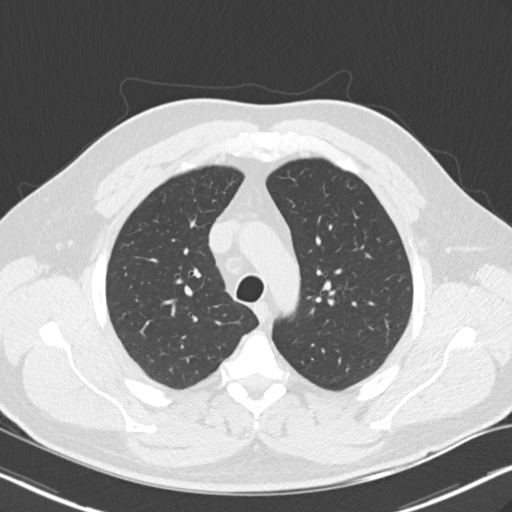
[im 134/171  lung]
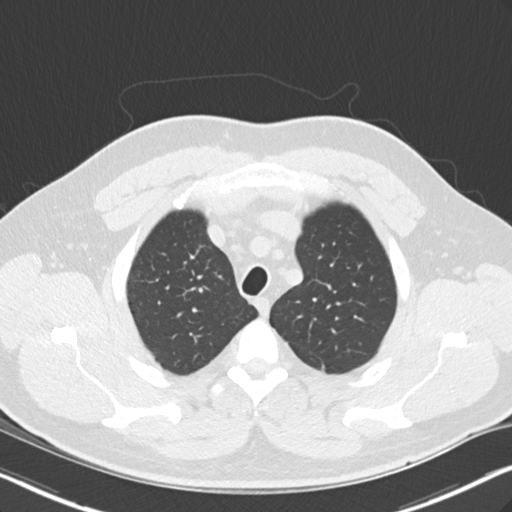
[im 146/171  mediastinal]
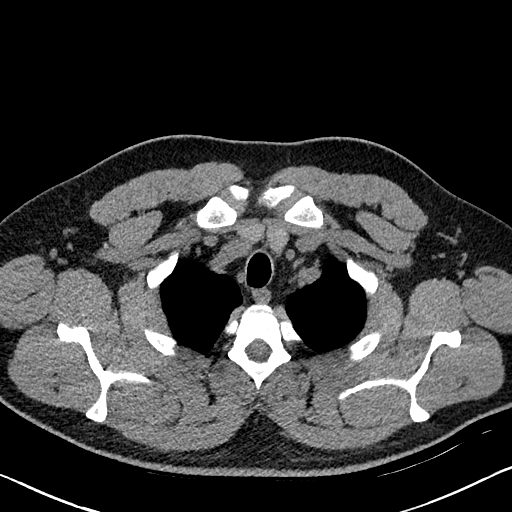
[im 146/171  lung]
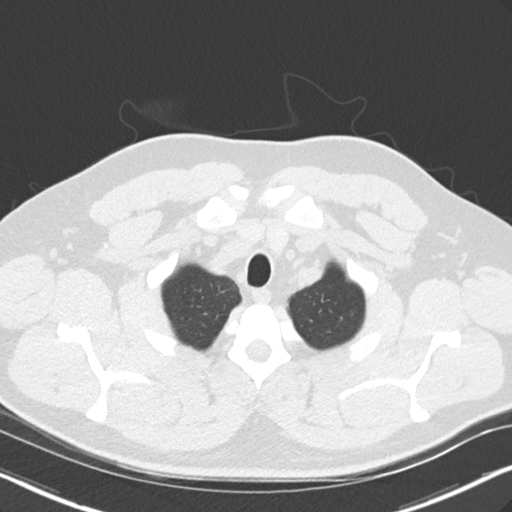
[im 158/171  lung]
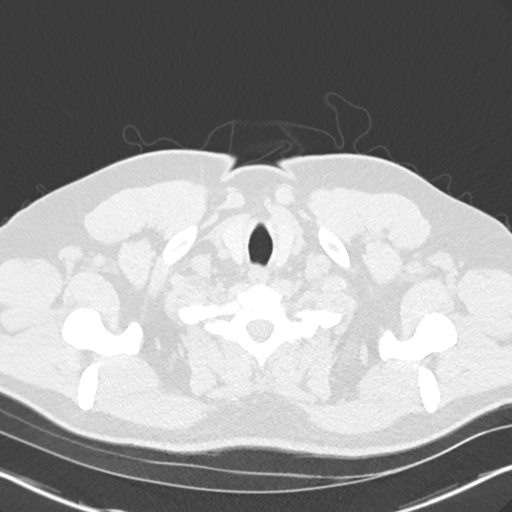

[14 of 30 positions shown; findings below may reference images not displayed]

FINDINGS: Cardiovascular: No evidence of thoracic aortic aneurysm is noted.
Normal cardiac size. No pericardial effusion. Mild coronary artery
calcifications are noted.

Mediastinum/Nodes: No enlarged mediastinal or axillary lymph nodes.
Thyroid gland, trachea, and esophagus demonstrate no significant
findings.

Lungs/Pleura: Lungs are clear. No pleural effusion or pneumothorax.

Upper Abdomen: No acute abnormality.

Musculoskeletal: No chest wall mass or suspicious bone lesions
identified.
IMPRESSION: 1. Mild coronary artery calcifications are noted suggesting coronary
artery disease.
2. No other abnormality seen in the chest.

## 2021-10-12 DIAGNOSIS — M228X1 Other disorders of patella, right knee: Secondary | ICD-10-CM | POA: Diagnosis not present

## 2021-10-12 DIAGNOSIS — M1711 Unilateral primary osteoarthritis, right knee: Secondary | ICD-10-CM | POA: Diagnosis not present

## 2021-10-12 DIAGNOSIS — S83411A Sprain of medial collateral ligament of right knee, initial encounter: Secondary | ICD-10-CM | POA: Diagnosis not present

## 2021-12-13 DIAGNOSIS — M25561 Pain in right knee: Secondary | ICD-10-CM | POA: Diagnosis not present

## 2021-12-13 DIAGNOSIS — M722 Plantar fascial fibromatosis: Secondary | ICD-10-CM | POA: Diagnosis not present

## 2021-12-13 DIAGNOSIS — E1165 Type 2 diabetes mellitus with hyperglycemia: Secondary | ICD-10-CM | POA: Diagnosis not present

## 2021-12-13 DIAGNOSIS — Z125 Encounter for screening for malignant neoplasm of prostate: Secondary | ICD-10-CM | POA: Diagnosis not present

## 2021-12-13 DIAGNOSIS — G4733 Obstructive sleep apnea (adult) (pediatric): Secondary | ICD-10-CM | POA: Diagnosis not present

## 2021-12-13 DIAGNOSIS — I10 Essential (primary) hypertension: Secondary | ICD-10-CM | POA: Diagnosis not present

## 2021-12-13 DIAGNOSIS — G8929 Other chronic pain: Secondary | ICD-10-CM | POA: Diagnosis not present

## 2021-12-13 DIAGNOSIS — R39198 Other difficulties with micturition: Secondary | ICD-10-CM | POA: Diagnosis not present

## 2021-12-20 DIAGNOSIS — G4709 Other insomnia: Secondary | ICD-10-CM | POA: Diagnosis not present

## 2021-12-20 DIAGNOSIS — Z9889 Other specified postprocedural states: Secondary | ICD-10-CM | POA: Diagnosis not present

## 2021-12-20 DIAGNOSIS — H6192 Disorder of left external ear, unspecified: Secondary | ICD-10-CM | POA: Diagnosis not present

## 2021-12-20 DIAGNOSIS — E782 Mixed hyperlipidemia: Secondary | ICD-10-CM | POA: Diagnosis not present

## 2021-12-20 DIAGNOSIS — Z6833 Body mass index (BMI) 33.0-33.9, adult: Secondary | ICD-10-CM | POA: Diagnosis not present

## 2021-12-20 DIAGNOSIS — R69 Illness, unspecified: Secondary | ICD-10-CM | POA: Diagnosis not present

## 2021-12-20 DIAGNOSIS — G4733 Obstructive sleep apnea (adult) (pediatric): Secondary | ICD-10-CM | POA: Diagnosis not present

## 2021-12-20 DIAGNOSIS — Z79899 Other long term (current) drug therapy: Secondary | ICD-10-CM | POA: Diagnosis not present

## 2021-12-20 DIAGNOSIS — I10 Essential (primary) hypertension: Secondary | ICD-10-CM | POA: Diagnosis not present

## 2021-12-20 DIAGNOSIS — Z1331 Encounter for screening for depression: Secondary | ICD-10-CM | POA: Diagnosis not present

## 2021-12-20 DIAGNOSIS — E1165 Type 2 diabetes mellitus with hyperglycemia: Secondary | ICD-10-CM | POA: Diagnosis not present

## 2021-12-20 DIAGNOSIS — Z8739 Personal history of other diseases of the musculoskeletal system and connective tissue: Secondary | ICD-10-CM | POA: Diagnosis not present

## 2021-12-20 DIAGNOSIS — Z0000 Encounter for general adult medical examination without abnormal findings: Secondary | ICD-10-CM | POA: Diagnosis not present

## 2022-10-28 ENCOUNTER — Encounter: Payer: Self-pay | Admitting: Internal Medicine

## 2022-10-30 ENCOUNTER — Other Ambulatory Visit: Payer: Self-pay | Admitting: Internal Medicine

## 2022-10-30 DIAGNOSIS — I77811 Abdominal aortic ectasia: Secondary | ICD-10-CM

## 2022-11-12 ENCOUNTER — Ambulatory Visit
Admission: RE | Admit: 2022-11-12 | Discharge: 2022-11-12 | Disposition: A | Discharge status: Home / Self Care | Payer: 59 | Source: Ambulatory Visit | Attending: Internal Medicine | Admitting: Internal Medicine

## 2022-11-12 DIAGNOSIS — I77811 Abdominal aortic ectasia: Secondary | ICD-10-CM | POA: Insufficient documentation
